# Patient Record
Sex: Female | Born: 2000 | Race: Black or African American | Hispanic: No | Marital: Single | Smoking: Never smoker
Health system: Southern US, Community
[De-identification: ages and names within clinical notes are randomized; demographics above are authoritative.]

## PROBLEM LIST (undated history)

## (undated) DIAGNOSIS — Z789 Other specified health status: Secondary | ICD-10-CM

---

## 2019-04-07 ENCOUNTER — Other Ambulatory Visit: Payer: Self-pay

## 2019-04-07 ENCOUNTER — Emergency Department (HOSPITAL_COMMUNITY)
Admission: EM | Admit: 2019-04-07 | Discharge: 2019-04-08 | Disposition: A | Discharge status: Home / Self Care | Payer: Medicaid Other | Attending: Emergency Medicine | Admitting: Emergency Medicine

## 2019-04-07 ENCOUNTER — Encounter (HOSPITAL_COMMUNITY): Payer: Self-pay

## 2019-04-07 DIAGNOSIS — T7840XA Allergy, unspecified, initial encounter: Secondary | ICD-10-CM | POA: Insufficient documentation

## 2019-04-07 MED ORDER — HYDROXYZINE HCL 25 MG PO TABS
25.0000 mg | ORAL_TABLET | Freq: Once | ORAL | Status: AC
  Administered 2019-04-07: 25 mg via ORAL
  Filled 2019-04-07: qty 1

## 2019-04-07 MED ORDER — EPINEPHRINE 0.3 MG/0.3ML IJ SOAJ: 0.3000 mg | INTRAMUSCULAR | 0 refills | Status: AC | PRN

## 2019-04-07 MED ORDER — DEXAMETHASONE SODIUM PHOSPHATE 10 MG/ML IJ SOLN
10.0000 mg | Freq: Once | INTRAMUSCULAR | Status: AC
  Administered 2019-04-07: 10 mg via INTRAMUSCULAR
  Filled 2019-04-07: qty 1

## 2019-04-07 NOTE — ED Triage Notes (Signed)
Patient states she works at  [employer] for the past 5 months. Patient states recently she has developed hives on both her feet, thigh, legs, and face.   Today patient states her left eye is still a little swollen from this mornings reaction.   Patient states she has not taken anything for the hives or reaction.   Hives are gone on patients feet and legs.    A/Ox4 Ambulatory in triage.   Denies voice changes or shob.

## 2019-04-07 NOTE — Discharge Instructions (Addendum)
Please take benadryl at home to help with itching or rash.  If you experience any shortness of breath, chest pain, or difficulty breathing call 911.

## 2019-04-07 NOTE — ED Provider Notes (Signed)
[facility]-EMERGENCY DEPT Provider Note   CSN: [id number] Arrival date & time: 04/07/19  1740     History   Chief Complaint Chief Complaint  Patient presents with  . Allergic Reaction    HPI Julia Morton is a 18 y.o. female.     18 y.o female with no PMH presents to the ED with a chief complaint of allergic reaction.  Patient reports she walked out of work this morning around 4 AM, began to have itching on bilateral feet, she then broke into hives with move from her feet to her legs, to her knees, chest and her eyes. She reports applying topical cream to help with her symptoms without improvement. She has not taken any medical therapy for relieve in symptoms. She reports a similar episode the day prior to this occurring. She denies any allergic contacts, changes in soap, known allergies. She denies any chest pain, shortness of breath or other symptoms. Patient arrived in the ED asymptomatic without hives but wanted to be evaluated.   The history is provided by the patient.  Allergic Reaction   History reviewed. No pertinent past medical history.  There are no active problems to display for this patient.   History reviewed. No pertinent surgical history.   OB History   No obstetric history on file.      Home Medications    Prior to Admission medications   Medication Sig Start Date End Date Taking? Authorizing Provider  EPINEPHrine 0.3 mg/0.3 mL IJ SOAJ injection Inject 0.3 mLs (0.3 mg total) into the muscle as needed for up to 1 day for anaphylaxis. 04/07/19 04/08/19  Claude MangesSoto, Jaretzi Droz, PA-C    Family History History reviewed. No pertinent family history.  Social History Social History   Tobacco Use  . Smoking status: Never Smoker  . Smokeless tobacco: Never Used  Substance Use Topics  . Alcohol use: Not Currently  . Drug use: Not Currently     Allergies   Patient has no allergy information on record.   Review of Systems Review of  Systems  Constitutional: Negative for fever.  Respiratory: Negative for shortness of breath.   Cardiovascular: Negative for chest pain.  Neurological: Negative for headaches.     Physical Exam Updated Vital Signs BP 110/68 (BP Location: Left Arm)   Pulse (!) 53   Temp 98.5 F (36.9 C) (Oral)   Resp 16   Ht 5\' 7"  (1.702 m)   Wt 64 kg   LMP 03/07/2019   SpO2 100%   BMI 22.08 kg/m   Physical Exam Vitals signs and nursing note reviewed.  Constitutional:      General: She is not in acute distress.    Appearance: Normal appearance. She is well-developed.  HENT:     Head: Normocephalic and atraumatic.     Mouth/Throat:     Pharynx: No oropharyngeal exudate.  Eyes:     Pupils: Pupils are equal, round, and reactive to light.  Neck:     Musculoskeletal: Normal range of motion.  Cardiovascular:     Rate and Rhythm: Regular rhythm.     Heart sounds: Normal heart sounds.  Pulmonary:     Effort: Pulmonary effort is normal. No respiratory distress.     Breath sounds: Normal breath sounds.  Abdominal:     General: Bowel sounds are normal. There is no distension.     Palpations: Abdomen is soft.     Tenderness: There is no abdominal tenderness.  Musculoskeletal:  General: No tenderness or deformity.     Right lower leg: No edema.     Left lower leg: No edema.  Skin:    General: Skin is warm and dry.     Comments: No hives, no rash, or blistering.  Neurological:     Mental Status: She is alert and oriented to person, place, and time.      ED Treatments / Results  Labs (all labs ordered are listed, but only abnormal results are displayed) Labs Reviewed - No data to display  EKG None  Radiology No results found.  Procedures Procedures (including critical care time)  Medications Ordered in ED Medications  hydrOXYzine (ATARAX/VISTARIL) tablet 25 mg (has no administration in time range)  dexamethasone (DECADRON) injection 10 mg (has no administration in time  range)     Initial Impression / Assessment and Plan / ED Course  I have reviewed the triage vital signs and the nursing notes.  Pertinent labs & imaging results that were available during my care of the patient were reviewed by me and considered in my medical decision making (see chart for details).       Patient presents to the ED with a chief complaint of allergic reaction episode this morning around 4am. She reports breaking into hives, rash from her legs onto her face. She applied some topical cream which improved her symptoms. She is asymptomatic in the ED without shortness of breath, chest pain which she reports she did not have during this episode.   She is non toxic, well appearing without any hives but does have some residual itching. No blistering, rashes, dysphagia, or shortness of breath. Will provide with atarax to help with itching.  She also received a decadron shot to suppress future reactions. Patient will be discharged home with an epi pen, was instructed on when to use epi pen. She understands and agrees with management. Return precautions at length.     Portions of this note were generated with [employer]. Dictation errors may occur despite best attempts at proofreading.  Final Clinical Impressions(s) / ED Diagnoses   Final diagnoses:  Allergic reaction, initial encounter    ED Discharge Orders         Ordered    EPINEPHrine 0.3 mg/0.3 mL IJ SOAJ injection  As needed     04/07/19 2327           Janeece Fitting, Hershal Coria 04/07/19 2329    Dorie Rank, MD 04/09/19 2041

## 2019-04-07 NOTE — ED Notes (Addendum)
Pt states this morning after getting off of work at [employer] this morning, she noticed tiny bumps on her ankles that spread to her hips and progressed to her neck and face. Pt states her eyes were swollen shut. Pt states that she was extremely itchy, so she used a cream that she had at home to help subside the itching.Pt states it helped until about 1pm this afternoon but after that the itching began again. Pt denies the use of new soaps or perfumes, pt denies ingestion of any unfamiliar food or drink. Pt denies any nausea, vomiting or diarrhea. Pt denies any difficulty breathing. Pt denies history of allergies or asthma. Pt states that she did purchase a new unscented lotion for sensitive skin.

## 2019-05-05 ENCOUNTER — Other Ambulatory Visit: Payer: Self-pay

## 2019-05-05 ENCOUNTER — Encounter (HOSPITAL_COMMUNITY): Payer: Self-pay

## 2019-05-05 ENCOUNTER — Ambulatory Visit (HOSPITAL_COMMUNITY)
Admission: EM | Admit: 2019-05-05 | Discharge: 2019-05-05 | Disposition: A | Discharge status: Home / Self Care | Payer: Medicaid Other | Attending: Emergency Medicine | Admitting: Emergency Medicine

## 2019-05-05 ENCOUNTER — Ambulatory Visit (INDEPENDENT_AMBULATORY_CARE_PROVIDER_SITE_OTHER): Payer: Medicaid Other

## 2019-05-05 DIAGNOSIS — S92524A Nondisplaced fracture of medial phalanx of right lesser toe(s), initial encounter for closed fracture: Secondary | ICD-10-CM

## 2019-05-05 MED ORDER — HYDROCODONE-ACETAMINOPHEN 5-325 MG PO TABS: 2.0000 | ORAL_TABLET | ORAL | 0 refills | Status: DC | PRN

## 2019-05-05 MED ORDER — IBUPROFEN 800 MG PO TABS: 800.0000 mg | ORAL_TABLET | Freq: Three times a day (TID) | ORAL | 0 refills | Status: DC | PRN

## 2019-05-05 NOTE — ED Provider Notes (Signed)
[facility]    CSN: [id number] Arrival date & time: 05/05/19  1558      History   Chief Complaint Chief Complaint  Patient presents with  . Toe Pain    HPI Julia Morton is a 18 y.o. female.   Patient presents with pain, bruising, and swelling in her right 5th toe after hitting it on the corner of her bed 3 days ago.  She denies numbness, weakness, paresthesias.  LMP: 04/12/2019.  The history is provided by the patient.    History reviewed. No pertinent past medical history.  There are no active problems to display for this patient.   History reviewed. No pertinent surgical history.  OB History   No obstetric history on file.      Home Medications    Prior to Admission medications   Medication Sig Start Date End Date Taking? Authorizing Provider  HYDROcodone-acetaminophen (NORCO/VICODIN) 5-325 MG tablet Take 2 tablets by mouth every 4 (four) hours as needed. 05/05/19   Sharion Balloon, NP  ibuprofen (ADVIL) 800 MG tablet Take 1 tablet (800 mg total) by mouth every 8 (eight) hours as needed. 05/05/19   Sharion Balloon, NP    Family History History reviewed. No pertinent family history.  Social History Social History   Tobacco Use  . Smoking status: Never Smoker  . Smokeless tobacco: Never Used  Substance Use Topics  . Alcohol use: Not Currently  . Drug use: Not Currently     Allergies   Patient has no known allergies.   Review of Systems Review of Systems  Constitutional: Negative for chills and fever.  HENT: Negative for ear pain and sore throat.   Eyes: Negative for pain and visual disturbance.  Respiratory: Negative for cough and shortness of breath.   Cardiovascular: Negative for chest pain and palpitations.  Gastrointestinal: Negative for abdominal pain and vomiting.  Genitourinary: Negative for dysuria and hematuria.  Musculoskeletal: Positive for arthralgias. Negative for back pain.  Skin: Negative for rash.  Neurological:  Negative for seizures and syncope.  All other systems reviewed and are negative.    Physical Exam Triage Vital Signs ED Triage Vitals  Enc Vitals Group     BP 05/05/19 1620 110/62     Pulse Rate 05/05/19 1620 84     Resp 05/05/19 1620 16     Temp 05/05/19 1620 98 F (36.7 C)     Temp Source 05/05/19 1620 Oral     SpO2 05/05/19 1620 100 %     Weight 05/05/19 1617 142 lb (64.4 kg)     Height --      Head Circumference --      Peak Flow --      Pain Score 05/05/19 1617 8     Pain Loc --      Pain Edu? --      Excl. in [place]? --    No data found.  Updated Vital Signs BP 110/62 (BP Location: Right Arm)   Pulse 84   Temp 98 F (36.7 C) (Oral)   Resp 16   Wt 142 lb (64.4 kg)   LMP 04/12/2019   SpO2 100%   BMI 22.24 kg/m   Visual Acuity Right Eye Distance:   Left Eye Distance:   Bilateral Distance:    Right Eye Near:   Left Eye Near:    Bilateral Near:     Physical Exam Vitals signs and nursing note reviewed.  Constitutional:      General:  She is not in acute distress.    Appearance: She is well-developed.  HENT:     Head: Normocephalic and atraumatic.  Eyes:     Conjunctiva/sclera: Conjunctivae normal.  Neck:     Musculoskeletal: Neck supple.  Cardiovascular:     Rate and Rhythm: Normal rate and regular rhythm.     Heart sounds: No murmur.  Pulmonary:     Effort: Pulmonary effort is normal. No respiratory distress.     Breath sounds: Normal breath sounds.  Abdominal:     Palpations: Abdomen is soft.     Tenderness: There is no abdominal tenderness.  Musculoskeletal:        General: Swelling and tenderness present.     Comments: Right 5th toe tender, edematous, ecchymotic.   Skin:    General: Skin is warm and dry.     Capillary Refill: Capillary refill takes less than 2 seconds.     Findings: Bruising present.  Neurological:     General: No focal deficit present.     Mental Status: She is alert and oriented to person, place, and time.     Sensory: No  sensory deficit.     Motor: No weakness.      UC Treatments / Results  Labs (all labs ordered are listed, but only abnormal results are displayed) Labs Reviewed - No data to display  EKG   Radiology Dg Foot Complete Right  Result Date: 05/05/2019 CLINICAL DATA:  Pain laterally after hitting foot against solid object EXAM: RIGHT FOOT COMPLETE - 3+ VIEW COMPARISON:  None. FINDINGS: Frontal, oblique, and lateral views were obtained. There is a nondisplaced fracture of the distal aspect of the fifth middle phalanx with alignment anatomic. No other evident fracture. No dislocation. Joint spaces appear normal. No erosive change. IMPRESSION: Nondisplaced fracture, distal portion of fifth middle phalanx. No other fracture. No dislocation. No appreciable arthropathy. These results will be called to the ordering clinician or representative by the Radiologist Assistant, and communication documented in the PACS or zVision Dashboard. Electronically Signed   By: Bretta Bang III M.D.   On: 05/05/2019 17:09    Procedures Procedures (including critical care time)  Medications Ordered in UC Medications - No data to display  Initial Impression / Assessment and Plan / UC Course  I have reviewed the triage vital signs and the nursing notes.  Pertinent labs & imaging results that were available during my care of the patient were reviewed by me and considered in my medical decision making (see chart for details).   Fractured fifth toe.  Xray shows "Nondisplaced fracture, distal portion of fifth middle phalanx".  Buddy tape and postop shoe applied.  Treating pain with ibuprofen and Norco as needed.  Precautions for drowsiness with Norco discussed with patient.  Instructed patient to follow-up with orthopedics.  Patient agrees to plan of care.   Final Clinical Impressions(s) / UC Diagnoses   Final diagnoses:  Closed nondisplaced fracture of middle phalanx of lesser toe of right foot, initial  encounter     Discharge Instructions     Take the ibuprofen as prescribed for pain.  Take the prescribed Norco as needed for severe pain; do not drive, operate machinery, or drink alcohol with this medication as it may cause drowsiness.    Rest and elevate your foot.  Apply ice packs 2-3 times a day for up to 20 minutes each.    Follow up with the orthopedist listed below.       ED  Prescriptions    Medication Sig Dispense Auth. Provider   ibuprofen (ADVIL) 800 MG tablet Take 1 tablet (800 mg total) by mouth every 8 (eight) hours as needed. 21 tablet Mickie Bailate, Terasa Orsini H, NP   HYDROcodone-acetaminophen (NORCO/VICODIN) 5-325 MG tablet Take 2 tablets by mouth every 4 (four) hours as needed. 6 tablet Mickie Bailate, Fergie Sherbert H, NP     I have reviewed the PDMP during this encounter.   Mickie Bailate, Kaysin Brock H, NP 05/05/19 1721

## 2019-05-05 NOTE — ED Triage Notes (Signed)
Pt states she stubed her right toe ( pinky ) on the corner of the bed. X 3 days ago.

## 2019-05-05 NOTE — Discharge Instructions (Signed)
Take the ibuprofen as prescribed for pain.  Take the prescribed Norco as needed for severe pain; do not drive, operate machinery, or drink alcohol with this medication as it may cause drowsiness.    Rest and elevate your foot.  Apply ice packs 2-3 times a day for up to 20 minutes each.    Follow up with the orthopedist listed below.

## 2019-07-01 LAB — OB RESULTS CONSOLE ABO/RH: RH Type: POSITIVE

## 2019-07-01 LAB — OB RESULTS CONSOLE RPR: RPR: NONREACTIVE

## 2019-07-01 LAB — OB RESULTS CONSOLE RUBELLA ANTIBODY, IGM: Rubella: IMMUNE

## 2019-07-01 LAB — OB RESULTS CONSOLE GC/CHLAMYDIA
Chlamydia: NEGATIVE
Gonorrhea: NEGATIVE

## 2019-07-01 LAB — OB RESULTS CONSOLE HIV ANTIBODY (ROUTINE TESTING): HIV: NONREACTIVE

## 2019-07-01 LAB — OB RESULTS CONSOLE HEPATITIS B SURFACE ANTIGEN: Hepatitis B Surface Ag: NEGATIVE

## 2019-07-24 NOTE — L&D Delivery Note (Signed)
Delivery Note   Patient Name: Julia Morton DOB: 26-Dec-2000 MRN: [id number]  Date of admission: 01/13/2020 Delivering MD: Dale Wallace  Date of delivery: 01/14/20 Type of delivery: SVD  Newborn Data: Live born female  Birth Weight:   APGAR: ,   Newborn Delivery   Birth date/time: 01/14/2020 03:25:00 Delivery type: Vaginal, Spontaneous     Julia Morton, 19 y.o., @ [redacted]w[redacted]d,  G1P0, who was admitted for Spontaneous labor early with elevated BP, Dx with GHTN, BP currently 132/69, denies HA, RUQ pain or vision changes, labs unremarkable, PCR 0.17, plan for repeat labs in the morning. I was called to the room when she progressed 2+ station in the second stage of labor. RN reported fetal prolonged decel to nadir of 60s, Fluid bolus given and maternal position chang resolved with rapid decent from 7cm to complete with SROM, clear @ 0230. Right bartholin cyst was noted again to be swollen, blunt needle aspiration was under and drained of fluid. She pushed for 20/min.  She delivered a viable infant, cephalic and restituted to the LOA position over an intact perineum.  A nuchal cord   was identified, plus one body cord, unable to reduce, baby was somersaulted through and tolerated well. The baby was placed on maternal abdomen while initial step of NRP were perfmored (Dry, Stimulated, and warmed). Hat placed on baby for thermoregulation. Delayed cord clamping was performed for 2 minutes.  Cord double clamped and cut.  Cord cut by father. Apgar scores were 8 and 9. Prophylactic Pitocin was started in the third stage of labor for active management. The placenta delivered spontaneously, shultz, with a 3 vessel cord and was sent to LD.  Inspection revealed none. An examination of the vaginal vault and cervix was free from lacerations. The uterus was firm, bleeding stable. Placenta and umbilical artery blood gas were not sent.  There were no complications during the procedure.  Mom and baby skin to skin  following delivery. Left in stable condition.  Maternal Info: Anesthesia: Epidural Episiotomy: No Lacerations:  No Suture Repair: No Est. Blood Loss (mL):   Newborn Info:  Baby Sex: female Circumcision: out pt circ Babies Name: Jamire APGAR (1 MIN):   APGAR (5 MINS):   APGAR (10 MINS):     Mom to postpartum.  Baby to Couplet care / Skin to Skin.    [place], [place], NP-C 01/14/20 3:49 AM

## 2019-08-28 ENCOUNTER — Other Ambulatory Visit (HOSPITAL_COMMUNITY): Payer: Self-pay | Admitting: Obstetrics and Gynecology

## 2019-08-28 DIAGNOSIS — Z3A22 22 weeks gestation of pregnancy: Secondary | ICD-10-CM

## 2019-08-28 DIAGNOSIS — Z3689 Encounter for other specified antenatal screening: Secondary | ICD-10-CM

## 2019-09-15 ENCOUNTER — Encounter (HOSPITAL_COMMUNITY): Payer: Self-pay | Admitting: *Deleted

## 2019-09-17 ENCOUNTER — Other Ambulatory Visit (HOSPITAL_COMMUNITY): Payer: Self-pay | Admitting: *Deleted

## 2019-09-17 ENCOUNTER — Ambulatory Visit (HOSPITAL_COMMUNITY)
Admission: RE | Admit: 2019-09-17 | Discharge: 2019-09-17 | Disposition: A | Discharge status: Home / Self Care | Payer: Medicaid Other | Source: Ambulatory Visit | Attending: Obstetrics and Gynecology | Admitting: Obstetrics and Gynecology

## 2019-09-17 ENCOUNTER — Other Ambulatory Visit: Payer: Self-pay

## 2019-09-17 DIAGNOSIS — Z363 Encounter for antenatal screening for malformations: Secondary | ICD-10-CM | POA: Diagnosis not present

## 2019-09-17 DIAGNOSIS — Z3A22 22 weeks gestation of pregnancy: Secondary | ICD-10-CM | POA: Insufficient documentation

## 2019-09-17 DIAGNOSIS — Z3689 Encounter for other specified antenatal screening: Secondary | ICD-10-CM | POA: Insufficient documentation

## 2019-09-17 DIAGNOSIS — Z362 Encounter for other antenatal screening follow-up: Secondary | ICD-10-CM

## 2019-09-18 ENCOUNTER — Other Ambulatory Visit (HOSPITAL_COMMUNITY): Payer: Self-pay | Admitting: Obstetrics & Gynecology

## 2019-09-18 ENCOUNTER — Other Ambulatory Visit (HOSPITAL_COMMUNITY): Payer: Self-pay | Admitting: Obstetrics and Gynecology

## 2019-09-18 ENCOUNTER — Encounter (HOSPITAL_COMMUNITY): Payer: Self-pay | Admitting: Obstetrics and Gynecology

## 2019-10-15 ENCOUNTER — Other Ambulatory Visit: Payer: Self-pay

## 2019-10-15 ENCOUNTER — Ambulatory Visit (HOSPITAL_COMMUNITY)
Admission: RE | Admit: 2019-10-15 | Discharge: 2019-10-15 | Disposition: A | Discharge status: Home / Self Care | Payer: Medicaid Other | Source: Ambulatory Visit | Attending: Obstetrics and Gynecology | Admitting: Obstetrics and Gynecology

## 2019-10-15 ENCOUNTER — Encounter (HOSPITAL_COMMUNITY): Payer: Self-pay

## 2019-10-15 ENCOUNTER — Ambulatory Visit (HOSPITAL_COMMUNITY): Payer: Medicaid Other

## 2019-10-15 DIAGNOSIS — Z362 Encounter for other antenatal screening follow-up: Secondary | ICD-10-CM | POA: Diagnosis not present

## 2019-10-15 DIAGNOSIS — Z3A26 26 weeks gestation of pregnancy: Secondary | ICD-10-CM | POA: Diagnosis not present

## 2019-10-15 DIAGNOSIS — O321XX0 Maternal care for breech presentation, not applicable or unspecified: Secondary | ICD-10-CM

## 2019-12-23 LAB — OB RESULTS CONSOLE GBS: GBS: NEGATIVE

## 2020-01-13 ENCOUNTER — Encounter (HOSPITAL_COMMUNITY): Payer: Self-pay

## 2020-01-13 ENCOUNTER — Inpatient Hospital Stay (HOSPITAL_COMMUNITY)
Admission: EM | Admit: 2020-01-13 | Discharge: 2020-01-13 | Disposition: A | Discharge status: Home / Self Care | Payer: Medicaid Other | Source: Home / Self Care | Attending: Emergency Medicine | Admitting: Emergency Medicine

## 2020-01-13 ENCOUNTER — Inpatient Hospital Stay (HOSPITAL_COMMUNITY)
Admission: AD | Admit: 2020-01-13 | Discharge: 2020-01-16 | DRG: 807 | Disposition: A | Discharge status: Home / Self Care | Payer: Medicaid Other | Attending: Obstetrics & Gynecology | Admitting: Obstetrics & Gynecology

## 2020-01-13 ENCOUNTER — Encounter (HOSPITAL_COMMUNITY): Payer: Self-pay | Admitting: Obstetrics and Gynecology

## 2020-01-13 ENCOUNTER — Other Ambulatory Visit: Payer: Self-pay

## 2020-01-13 ENCOUNTER — Inpatient Hospital Stay (HOSPITAL_COMMUNITY): Payer: Medicaid Other | Admitting: Anesthesiology

## 2020-01-13 DIAGNOSIS — O471 False labor at or after 37 completed weeks of gestation: Secondary | ICD-10-CM | POA: Insufficient documentation

## 2020-01-13 DIAGNOSIS — O134 Gestational [pregnancy-induced] hypertension without significant proteinuria, complicating childbirth: Principal | ICD-10-CM | POA: Diagnosis present

## 2020-01-13 DIAGNOSIS — O99893 Other specified diseases and conditions complicating puerperium: Secondary | ICD-10-CM | POA: Diagnosis present

## 2020-01-13 DIAGNOSIS — Z20822 Contact with and (suspected) exposure to covid-19: Secondary | ICD-10-CM | POA: Diagnosis present

## 2020-01-13 DIAGNOSIS — Z880 Allergy status to penicillin: Secondary | ICD-10-CM

## 2020-01-13 DIAGNOSIS — N750 Cyst of Bartholin's gland: Secondary | ICD-10-CM | POA: Diagnosis present

## 2020-01-13 DIAGNOSIS — O9902 Anemia complicating childbirth: Secondary | ICD-10-CM

## 2020-01-13 DIAGNOSIS — O479 False labor, unspecified: Secondary | ICD-10-CM

## 2020-01-13 DIAGNOSIS — O139 Gestational [pregnancy-induced] hypertension without significant proteinuria, unspecified trimester: Secondary | ICD-10-CM | POA: Diagnosis present

## 2020-01-13 DIAGNOSIS — R030 Elevated blood-pressure reading, without diagnosis of hypertension: Secondary | ICD-10-CM | POA: Diagnosis present

## 2020-01-13 DIAGNOSIS — Z3A39 39 weeks gestation of pregnancy: Secondary | ICD-10-CM

## 2020-01-13 HISTORY — DX: Other specified health status: Z78.9

## 2020-01-13 LAB — TYPE AND SCREEN
ABO/RH(D): O POS
Antibody Screen: NEGATIVE

## 2020-01-13 LAB — CBC
HCT: 36.9 % (ref 36.0–46.0)
Hemoglobin: 11.5 g/dL — ABNORMAL LOW (ref 12.0–15.0)
MCH: 25.2 pg — ABNORMAL LOW (ref 26.0–34.0)
MCHC: 31.2 g/dL (ref 30.0–36.0)
MCV: 80.9 fL (ref 80.0–100.0)
Platelets: 275 10*3/uL (ref 150–400)
RBC: 4.56 MIL/uL (ref 3.87–5.11)
RDW: 14.2 % (ref 11.5–15.5)
WBC: 17.3 10*3/uL — ABNORMAL HIGH (ref 4.0–10.5)
nRBC: 0 % (ref 0.0–0.2)

## 2020-01-13 LAB — COMPREHENSIVE METABOLIC PANEL
ALT: 14 U/L (ref 0–44)
ALT: 15 U/L (ref 0–44)
AST: 21 U/L (ref 15–41)
AST: 22 U/L (ref 15–41)
Albumin: 3.2 g/dL — ABNORMAL LOW (ref 3.5–5.0)
Albumin: 2.9 g/dL — ABNORMAL LOW (ref 3.5–5.0)
Alkaline Phosphatase: 172 U/L — ABNORMAL HIGH (ref 38–126)
Alkaline Phosphatase: 173 U/L — ABNORMAL HIGH (ref 38–126)
Anion gap: 8 (ref 5–15)
Anion gap: 9 (ref 5–15)
BUN: 9 mg/dL (ref 6–20)
BUN: 8 mg/dL (ref 6–20)
CO2: 21 mmol/L — ABNORMAL LOW (ref 22–32)
CO2: 19 mmol/L — ABNORMAL LOW (ref 22–32)
Calcium: 9.4 mg/dL (ref 8.9–10.3)
Calcium: 9.3 mg/dL (ref 8.9–10.3)
Chloride: 106 mmol/L (ref 98–111)
Chloride: 106 mmol/L (ref 98–111)
Creatinine, Ser: 0.75 mg/dL (ref 0.44–1.00)
Creatinine, Ser: 0.72 mg/dL (ref 0.44–1.00)
GFR calc Af Amer: >60 mL/min (ref >60)
GFR calc Af Amer: >60 mL/min (ref >60)
GFR calc non Af Amer: >60 mL/min (ref >60)
GFR calc non Af Amer: >60 mL/min (ref >60)
Glucose, Bld: 96 mg/dL (ref 70–99)
Glucose, Bld: 102 mg/dL — ABNORMAL HIGH (ref 70–99)
Potassium: 4 mmol/L (ref 3.5–5.1)
Potassium: 4.2 mmol/L (ref 3.5–5.1)
Sodium: 135 mmol/L (ref 135–145)
Sodium: 134 mmol/L — ABNORMAL LOW (ref 135–145)
Total Bilirubin: 0.6 mg/dL (ref 0.3–1.2)
Total Bilirubin: 0.7 mg/dL (ref 0.3–1.2)
Total Protein: 6.6 g/dL (ref 6.5–8.1)
Total Protein: 6 g/dL — ABNORMAL LOW (ref 6.5–8.1)

## 2020-01-13 LAB — CBC WITH DIFFERENTIAL/PLATELET
Abs Immature Granulocytes: 0.21 10*3/uL — ABNORMAL HIGH (ref 0.00–0.07)
Basophils Absolute: 0 10*3/uL (ref 0.0–0.1)
Basophils Relative: 0 %
Eosinophils Absolute: 0.1 10*3/uL (ref 0.0–0.5)
Eosinophils Relative: 1 %
HCT: 36.1 % (ref 36.0–46.0)
Hemoglobin: 11.4 g/dL — ABNORMAL LOW (ref 12.0–15.0)
Immature Granulocytes: 2 %
Lymphocytes Relative: 15 %
Lymphs Abs: 2 10*3/uL (ref 0.7–4.0)
MCH: 25.5 pg — ABNORMAL LOW (ref 26.0–34.0)
MCHC: 31.6 g/dL (ref 30.0–36.0)
MCV: 80.8 fL (ref 80.0–100.0)
Monocytes Absolute: 1.1 10*3/uL — ABNORMAL HIGH (ref 0.1–1.0)
Monocytes Relative: 8 %
Neutro Abs: 10 10*3/uL — ABNORMAL HIGH (ref 1.7–7.7)
Neutrophils Relative %: 74 %
Platelets: 235 10*3/uL (ref 150–400)
RBC: 4.47 MIL/uL (ref 3.87–5.11)
RDW: 14.3 % (ref 11.5–15.5)
WBC: 13.5 10*3/uL — ABNORMAL HIGH (ref 4.0–10.5)
nRBC: 0 % (ref 0.0–0.2)

## 2020-01-13 LAB — PROTEIN / CREATININE RATIO, URINE
Creatinine, Urine: 114.67 mg/dL
Protein Creatinine Ratio: 0.17 mg/mg{Cre} — ABNORMAL HIGH (ref 0.00–0.15)
Total Protein, Urine: 19 mg/dL

## 2020-01-13 LAB — LACTATE DEHYDROGENASE: LDH: 174 U/L (ref 98–192)

## 2020-01-13 LAB — URIC ACID: Uric Acid, Serum: 4.1 mg/dL (ref 2.5–7.1)

## 2020-01-13 LAB — ABO/RH: ABO/RH(D): O POS

## 2020-01-13 LAB — SARS CORONAVIRUS 2 BY RT PCR (HOSPITAL ORDER, PERFORMED IN ~~LOC~~ HOSPITAL LAB): SARS Coronavirus 2: NEGATIVE

## 2020-01-13 LAB — RPR: RPR Ser Ql: NONREACTIVE

## 2020-01-13 MED ORDER — DIPHENHYDRAMINE HCL 50 MG/ML IJ SOLN: 12.5000 mg | INTRAMUSCULAR | Status: DC | PRN

## 2020-01-13 MED ORDER — OXYTOCIN BOLUS FROM INFUSION
333.0000 mL | Freq: Once | INTRAVENOUS | Status: AC
  Administered 2020-01-14: 333 mL via INTRAVENOUS

## 2020-01-13 MED ORDER — DIPHENHYDRAMINE HCL 50 MG/ML IJ SOLN
12.5000 mg | INTRAMUSCULAR | Status: DC | PRN
  Administered 2020-01-13: 12.5 mg via INTRAVENOUS
  Filled 2020-01-13: qty 1

## 2020-01-13 MED ORDER — PHENYLEPHRINE 40 MCG/ML (10ML) SYRINGE FOR IV PUSH (FOR BLOOD PRESSURE SUPPORT): 80.0000 ug | PREFILLED_SYRINGE | INTRAVENOUS | Status: DC | PRN

## 2020-01-13 MED ORDER — EPHEDRINE 5 MG/ML INJ: 10.0000 mg | INTRAVENOUS | Status: DC | PRN

## 2020-01-13 MED ORDER — LACTATED RINGERS IV SOLN: 500.0000 mL | INTRAVENOUS | Status: DC | PRN

## 2020-01-13 MED ORDER — LIDOCAINE HCL (PF) 1 % IJ SOLN: 30.0000 mL | INTRAMUSCULAR | Status: DC | PRN

## 2020-01-13 MED ORDER — MORPHINE SULFATE (PF) 4 MG/ML IV SOLN
2.0000 mg | Freq: Once | INTRAVENOUS | Status: AC
  Administered 2020-01-13: 2 mg via INTRAVENOUS
  Filled 2020-01-13: qty 1

## 2020-01-13 MED ORDER — FENTANYL CITRATE (PF) 100 MCG/2ML IJ SOLN
100.0000 ug | Freq: Once | INTRAMUSCULAR | Status: AC
  Administered 2020-01-13: 100 ug via INTRAVENOUS
  Filled 2020-01-13: qty 2

## 2020-01-13 MED ORDER — ACETAMINOPHEN 325 MG PO TABS: 650.0000 mg | ORAL_TABLET | ORAL | Status: DC | PRN

## 2020-01-13 MED ORDER — OXYTOCIN-SODIUM CHLORIDE 30-0.9 UT/500ML-% IV SOLN
1.0000 m[IU]/min | INTRAVENOUS | Status: DC
  Filled 2020-01-13: qty 500

## 2020-01-13 MED ORDER — OXYTOCIN-SODIUM CHLORIDE 30-0.9 UT/500ML-% IV SOLN: 2.5000 [IU]/h | INTRAVENOUS | Status: DC

## 2020-01-13 MED ORDER — PROMETHAZINE HCL 25 MG/ML IJ SOLN
12.5000 mg | Freq: Once | INTRAMUSCULAR | Status: AC
  Administered 2020-01-13: 12.5 mg via INTRAVENOUS
  Filled 2020-01-13: qty 1

## 2020-01-13 MED ORDER — SODIUM CHLORIDE (PF) 0.9 % IJ SOLN
INTRAMUSCULAR | Status: DC | PRN
  Administered 2020-01-13: 12 mL/h via EPIDURAL

## 2020-01-13 MED ORDER — TERBUTALINE SULFATE 1 MG/ML IJ SOLN: 0.2500 mg | Freq: Once | INTRAMUSCULAR | Status: DC | PRN

## 2020-01-13 MED ORDER — FENTANYL-BUPIVACAINE-NACL 0.5-0.125-0.9 MG/250ML-% EP SOLN
12.0000 mL/h | EPIDURAL | Status: DC | PRN
  Filled 2020-01-13: qty 250

## 2020-01-13 MED ORDER — ONDANSETRON HCL 4 MG/2ML IJ SOLN: 4.0000 mg | Freq: Four times a day (QID) | INTRAMUSCULAR | Status: DC | PRN

## 2020-01-13 MED ORDER — LACTATED RINGERS IV SOLN: INTRAVENOUS | Status: DC

## 2020-01-13 MED ORDER — NALBUPHINE HCL 10 MG/ML IJ SOLN: 5.0000 mg | Freq: Once | INTRAMUSCULAR | Status: DC

## 2020-01-13 MED ORDER — OXYCODONE-ACETAMINOPHEN 5-325 MG PO TABS: 1.0000 | ORAL_TABLET | ORAL | Status: DC | PRN

## 2020-01-13 MED ORDER — FENTANYL-BUPIVACAINE-NACL 0.5-0.125-0.9 MG/250ML-% EP SOLN: 12.0000 mL/h | EPIDURAL | Status: DC | PRN

## 2020-01-13 MED ORDER — SODIUM CHLORIDE 0.9 % IV BOLUS (SEPSIS)
1000.0000 mL | Freq: Once | INTRAVENOUS | Status: AC
  Administered 2020-01-13: 1000 mL via INTRAVENOUS

## 2020-01-13 MED ORDER — FENTANYL CITRATE (PF) 100 MCG/2ML IJ SOLN
50.0000 ug | INTRAMUSCULAR | Status: DC | PRN
  Administered 2020-01-13 (×2): 100 ug via INTRAVENOUS
  Filled 2020-01-13 (×2): qty 2

## 2020-01-13 MED ORDER — LACTATED RINGERS IV SOLN
500.0000 mL | Freq: Once | INTRAVENOUS | Status: AC
  Administered 2020-01-14: 500 mL via INTRAVENOUS

## 2020-01-13 MED ORDER — LIDOCAINE HCL (PF) 1 % IJ SOLN
INTRAMUSCULAR | Status: DC | PRN
  Administered 2020-01-13: 6 mL via EPIDURAL

## 2020-01-13 MED ORDER — ONDANSETRON HCL 4 MG/2ML IJ SOLN
4.0000 mg | Freq: Once | INTRAMUSCULAR | Status: AC
  Administered 2020-01-13: 4 mg via INTRAVENOUS
  Filled 2020-01-13: qty 2

## 2020-01-13 MED ORDER — OXYCODONE-ACETAMINOPHEN 5-325 MG PO TABS: 2.0000 | ORAL_TABLET | ORAL | Status: DC | PRN

## 2020-01-13 MED ORDER — SOD CITRATE-CITRIC ACID 500-334 MG/5ML PO SOLN: 30.0000 mL | ORAL | Status: DC | PRN

## 2020-01-13 NOTE — Anesthesia Procedure Notes (Signed)
Epidural Patient location during procedure: OB Start time: 01/13/2020 8:12 PM End time: 01/13/2020 9:15 PM  Staffing Anesthesiologist: Bethena Midget, MD  Preanesthetic Checklist Completed: patient identified, IV checked, site marked, risks and benefits discussed, surgical consent, monitors and equipment checked, pre-op evaluation and timeout performed  Epidural Patient position: sitting Prep: DuraPrep and site prepped and draped Patient monitoring: continuous pulse ox and blood pressure Approach: midline Location: L3-L4 Injection technique: LOR air  Needle:  Needle type: Tuohy  Needle gauge: 17 G Needle length: 9 cm and 9 Needle insertion depth: 7 cm Catheter type: closed end flexible Catheter size: 19 Gauge Catheter at skin depth: 12 cm Test dose: negative  Assessment Events: blood not aspirated, injection not painful, no injection resistance, no paresthesia and negative IV test

## 2020-01-13 NOTE — MAU Provider Note (Signed)
Chief Complaint:  Laboring and Contractions    HPI: Julia Morton is a 19 y.o. G1P0 at [redacted]w[redacted]d who presents to maternity admissions reporting cxt that are classified as painful. Pt was at [facility] and given fentanyl for pain and transferred to our MAU, pt stable upon arrival ,and made 1cm cervical change, pt was given 2mg  morphine and 12.5mg  phenergan for nausea during her stay, but only made amount 0.5cm change over 4 hours, pt was discharged to home in stable condition and given labor precautions and informed to return if cxt became more regular. Denies contractions, leakage of fluid or vaginal bleeding. Good fetal movement.   Pregnancy Course:   Past Medical History:  Diagnosis Date   Medical history non-contributory    OB History  Gravida Para Term Preterm AB Living  1            SAB TAB Ectopic Multiple Live Births               # Outcome Date GA Lbr Len/2nd Weight Sex Delivery Anes PTL Lv  1 Current            History reviewed. No pertinent surgical history. History reviewed. No pertinent family history. Social History   Tobacco Use   Smoking status: Never Smoker   Smokeless tobacco: Never Used  Substance Use Topics   Alcohol use: Not Currently   Drug use: Not Currently   No Known Allergies No medications prior to admission.    I have reviewed patient's Past Medical Hx, Surgical Hx, Family Hx, Social Hx, medications and allergies.   ROS:  Review of Systems  Constitutional: Negative.   HENT: Negative.   Eyes: Negative.   Respiratory: Negative.   Cardiovascular: Negative.   Gastrointestinal: Positive for abdominal pain.  Endocrine: Negative.   Genitourinary: Negative.   Musculoskeletal: Negative.   Skin: Negative.   Allergic/Immunologic: Negative.   Neurological: Negative.   Hematological: Negative.   Psychiatric/Behavioral: Negative.     Physical Exam   Patient Vitals for the past 24 hrs:  BP Temp Temp src Pulse Resp SpO2  01/13/20 0843  127/83 98.6 F (37 C) Oral 82 18 --  01/13/20 0840 127/83 -- -- 75 -- 99 %  01/13/20 0830 137/81 -- -- 73 -- --  01/13/20 0604 135/71 -- -- 69 -- --  01/13/20 0527 130/73 98.4 F (36.9 C) Oral 72 -- --  01/13/20 0500 (!) 129/112 -- -- 84 12 98 %  01/13/20 0458 120/69 -- -- 80 -- 98 %  01/13/20 0454 -- -- -- 78 10 97 %  01/13/20 0445 (!) 141/84 -- -- 84 16 99 %  01/13/20 0430 (!) 148/100 99.7 F (37.6 C) Oral 83 10 99 %   Constitutional: Well-developed, well-nourished female in no acute distress.  Cardiovascular: normal rate Respiratory: normal effort GI: Abd soft, non-tender, gravid appropriate for gestational age. Pos BS x 4 MS: Extremities nontender, no edema, normal ROM Neurologic: Alert and oriented x 4.  GU: Neg CVAT.  Pelvic: NEFG, physiologic discharge, no blood, cervix clean. Pelvic adequate for labor. No CMT  Dilation: 2.5 Effacement (%): 70 Station: -2 Presentation: Vertex Exam by:: Tawanda Schall   NST: FHR baseline 125 bpm, Variability: moderate, Accelerations:present, Decelerations:  Absent= Cat 1/Reactive UC:   irregular, every 2-5 minutes, lasting 60 seconds.  SVE:   Dilation: 2.5 Effacement (%): 70 Station: -2 Exam by:: [facility], vertex verified by fetal sutures.  Leopold's: Position vertex, EFW 7lbs via leopold's.  Labs: Results for orders placed or performed during the hospital encounter of 01/13/20 (from the past 24 hour(s))  RPR     Status: None   Collection Time: 01/13/20  4:40 AM  Result Value Ref Range   RPR Ser Ql NON REACTIVE NON REACTIVE  CBC with Differential     Status: Abnormal   Collection Time: 01/13/20  4:43 AM  Result Value Ref Range   WBC 13.5 (H) 4.0 - 10.5 K/uL   RBC 4.47 3.87 - 5.11 MIL/uL   Hemoglobin 11.4 (L) 12.0 - 15.0 g/dL   HCT 36.1 36 - 46 %   MCV 80.8 80.0 - 100.0 fL   MCH 25.5 (L) 26.0 - 34.0 pg   MCHC 31.6 30.0 - 36.0 g/dL   RDW 14.3 11.5 - 15.5 %   Platelets 235 150 - 400 K/uL   nRBC 0.0 0.0 - 0.2 %    Neutrophils Relative % 74 %   Neutro Abs 10.0 (H) 1.7 - 7.7 K/uL   Lymphocytes Relative 15 %   Lymphs Abs 2.0 0.7 - 4.0 K/uL   Monocytes Relative 8 %   Monocytes Absolute 1.1 (H) 0 - 1 K/uL   Eosinophils Relative 1 %   Eosinophils Absolute 0.1 0 - 0 K/uL   Basophils Relative 0 %   Basophils Absolute 0.0 0 - 0 K/uL   Immature Granulocytes 2 %   Abs Immature Granulocytes 0.21 (H) 0.00 - 0.07 K/uL  Comprehensive metabolic panel     Status: Abnormal   Collection Time: 01/13/20  4:43 AM  Result Value Ref Range   Sodium 135 135 - 145 mmol/L   Potassium 4.0 3.5 - 5.1 mmol/L   Chloride 106 98 - 111 mmol/L   CO2 21 (L) 22 - 32 mmol/L   Glucose, Bld 96 70 - 99 mg/dL   BUN 9 6 - 20 mg/dL   Creatinine, Ser 0.75 0.44 - 1.00 mg/dL   Calcium 9.4 8.9 - 10.3 mg/dL   Total Protein 6.6 6.5 - 8.1 g/dL   Albumin 3.2 (L) 3.5 - 5.0 g/dL   AST 21 15 - 41 U/L   ALT 14 0 - 44 U/L   Alkaline Phosphatase 172 (H) 38 - 126 U/L   Total Bilirubin 0.6 0.3 - 1.2 mg/dL   GFR calc non Af Amer >60 >60 mL/min   GFR calc Af Amer >60 >60 mL/min   Anion gap 8 5 - 15    Imaging:  No results found.  MAU Course: Orders Placed This Encounter  Procedures   Urinalysis, Routine w reflex microscopic   CBC with Differential   Comprehensive metabolic panel   RPR   OB RESULTS CONSOLE GC/Chlamydia   OB RESULTS CONSOLE RPR   OB RESULTS CONSOLE HIV antibody   OB RESULTS CONSOLE Rubella Antibody   OB RESULTS CONSOLE Hepatitis B surface antigen   Continuous tocometry   Fetal Kick Count:  Lie on our left side for one hour after a meal, and count the number of times your baby kicks.  If it is less than 5 times, get up, move around and drink some juice.  Repeat the test 30 minutes later.  If it is still less than 5 kicks in an hour, notify your doctor.   OB RESULTS CONSOLE ABO/Rh   Discharge patient Discharge disposition: 01-Home or Self Care; Discharge patient date: 01/13/2020   Meds ordered this encounter   Medications   sodium chloride 0.9 % bolus 1,000 mL  fentaNYL (SUBLIMAZE) injection 100 mcg   ondansetron (ZOFRAN) injection 4 mg   DISCONTD: nalbuphine (NUBAIN) injection 5 mg   promethazine (PHENERGAN) injection 12.5 mg   morphine 4 MG/ML injection 2 mg    MDM: DR Dillard consulted, NST reactive, mild cervical change, pt discharged.   Assessment: 1. False labor   Julia Morton is a 19 y.o. G1P0 at [redacted]w[redacted]d who presents to maternity admissions reporting cxt that are classified as painful. Pt was at [facility] hospital and given fentanyl for pain and transferred to our MAU, pt stable upon arrival ,and made 1cm cervical change over 3 hours, pt was given 2mg  morphine and 12.5mg  phenergan for nausea during her stay, but only made amount 0.5cm change over 1.5 hours, pt was discharged to home in stable condition and given labor precautions and informed to return if cxt became more regular. Denies contractions, leakage of fluid or vaginal bleeding. Good fetal movement.  Plan: Discharge home in stable condition.  Labor precautions and fetal kick counts  Follow-up Information    [facility] Follow up.   Specialty: Obstetrics and Gynecology Why: ROB Contact information: [address] [place] [phone]              Allergies as of 01/13/2020   No Known Allergies     Medication List    TAKE these medications   Prenatal Gummies/DHA & FA 0.4-32.5 MG Chew Chew by mouth.       [facility] NP-C, CNM [place], [place], [place] 01/13/2020 1:11 PM

## 2020-01-13 NOTE — MAU Note (Signed)
Contractions since midnight, denies bleeding or leaking of fluid. Uncomplicated pregnancy

## 2020-01-13 NOTE — Progress Notes (Signed)
0424: OBRRN called to [facility] for pt term, potentially laboring.   7948[name] at bedside. Pt sees [facility]. 39.3 GA, G1P0. Reports ctx began around midnight and became unbearable around 3AM. Pt reports no issues with pregnancy. No LOF or vaginal bleeding. Pt endorses fetal movement. ED already spoke to [facility] who ordered transfer of pt to MAU.   0440: SVE perfeormed 1.5/60/-2 vtx.  0454: [facility] at bedside to transport pt to MAU.

## 2020-01-13 NOTE — Anesthesia Preprocedure Evaluation (Signed)
Anesthesia Evaluation  Patient identified by MRN, date of birth, ID band Patient awake    Reviewed: Allergy & Precautions, H&P , NPO status , Patient's Chart, lab work & pertinent test results, reviewed documented beta blocker date and time   Airway Mallampati: II  TM Distance: >3 FB Neck ROM: full    Dental no notable dental hx.    Pulmonary neg pulmonary ROS,    Pulmonary exam normal breath sounds clear to auscultation       Cardiovascular negative cardio ROS Normal cardiovascular exam Rhythm:regular Rate:Normal     Neuro/Psych negative neurological ROS  negative psych ROS   GI/Hepatic negative GI ROS, Neg liver ROS,   Endo/Other  negative endocrine ROS  Renal/GU negative Renal ROS  negative genitourinary   Musculoskeletal   Abdominal   Peds  Hematology negative hematology ROS (+)   Anesthesia Other Findings   Reproductive/Obstetrics (+) Pregnancy                             Anesthesia Physical Anesthesia Plan  ASA: II  Anesthesia Plan: Epidural   Post-op Pain Management:    Induction:   PONV Risk Score and Plan: 2  Airway Management Planned:   Additional Equipment:   Intra-op Plan:   Post-operative Plan:   Informed Consent: I have reviewed the patients History and Physical, chart, labs and discussed the procedure including the risks, benefits and alternatives for the proposed anesthesia with the patient or authorized representative who has indicated his/her understanding and acceptance.     Dental Advisory Given  Plan Discussed with: Anesthesiologist  Anesthesia Plan Comments: (Labs checked- platelets confirmed with RN in room. Fetal heart tracing, per RN, reported to be stable enough for sitting procedure. Discussed epidural, and patient consents to the procedure:  included risk of possible headache,backache, failed block, allergic reaction, and nerve injury. This  patient was asked if she had any questions or concerns before the procedure started.)        Anesthesia Quick Evaluation  

## 2020-01-13 NOTE — H&P (Signed)
Julia Morton is a 19 y.o. female, G1P0000, IUP at 39.3 weeks, presenting for spontaneous early labor with elevated BP, denies HA, RUQ pain, or vision changes, labs pending, normotensive through pregnancy.  Growth [place] on 5/12 was 5.6lbs, GBS- on 6/2, low risk female fetus. Pt endorse + Fm. Denies vaginal leakage. Denies vaginal bleeding.   There are no problems to display for this patient.    Medications Prior to Admission  Medication Sig Dispense Refill Last Dose   Prenatal MV-Min-FA-Omega-3 (PRENATAL GUMMIES/DHA & FA) 0.4-32.5 MG CHEW Chew by mouth.       Past Medical History:  Diagnosis Date   Medical history non-contributory      No current facility-administered medications on file prior to encounter.   Current Outpatient Medications on File Prior to Encounter  Medication Sig Dispense Refill   Prenatal MV-Min-FA-Omega-3 (PRENATAL GUMMIES/DHA & FA) 0.4-32.5 MG CHEW Chew by mouth.       No Known Allergies  History of present pregnancy: Pt Info/Preference:  Screening/Consents:  Labs:   EDD: Estimated Date of Delivery: 01/17/20  Establised: Patient's last menstrual period was 04/12/2019.  Anatomy Scan: Date: 10/15/2019 Placenta Location: posterior Genetic Screen: Panoroma:low risk female AFP:  First Tri: Quad:  Office: [facility]             First PNV: 11 wg Blood Type O/Positive/-- (12/09 0000)  Language: english Last PNV: 38.4 wg Rhogam    Flu Vaccine:  declined   Antibody    TDaP vaccine declined   GTT: Early: 5.4 hga1c Third Trimester: 99  Feeding Plan: Breast/bottle BTL: no Rubella: Immune (12/09 0000)  Contraception: ??? VBAC: no RPR: NON REACTIVE (06/23 0440)   Circumcision: ???   HBsAg: Negative (12/09 0000)  Pediatrician:  ???   HIV: Non-reactive (12/09 0000)   Prenatal Classes: no Additional [place]: Growth see above  GBS:  (For PCN allergy, check sensitivities)       Chlamydia: neg    MFM Referral/Consult:  GC: neg  Support Person: partner   PAP: N/A, underage  Pain  Management: epidural Neonatologist Referral:  Hgb Electrophoresis:  AA  Birth Plan: none   Hgb NOB: 14.1    28W: 12.5   OB History    Gravida  1   Para      Term      Preterm      AB      Living        SAB      TAB      Ectopic      Multiple      Live Births             Past Medical History:  Diagnosis Date   Medical history non-contributory    No past surgical history on file. Family History: family history is not on file. Social History:  reports that she has never smoked. She has never used smokeless tobacco. She reports previous alcohol use. She reports previous drug use.   Prenatal Transfer Tool  Maternal Diabetes: No Genetic Screening: Normal Maternal Ultrasounds/Referrals: Normal Fetal Ultrasounds or other Referrals:  None Maternal Substance Abuse:  No Significant Maternal Medications:  None Significant Maternal Lab Results: Group B Strep negative  ROS:  Review of Systems  Constitutional: Negative.   HENT: Negative.   Eyes: Negative.   Respiratory: Negative.   Cardiovascular: Negative.   Gastrointestinal: Positive for abdominal pain.  Genitourinary: Negative.   Musculoskeletal: Negative.   Skin: Negative.   Neurological: Negative.   Endo/Heme/Allergies:  Negative.   Psychiatric/Behavioral: Negative.      Physical Exam: LMP 04/12/2019   Physical Exam Vitals and nursing note reviewed.  Constitutional:      Appearance: Normal appearance.  HENT:     Head: Normocephalic and atraumatic.     Nose: Nose normal.     Mouth/Throat:     Mouth: Mucous membranes are moist.     Pharynx: Oropharynx is clear.  Eyes:     Extraocular Movements: Extraocular movements intact.     Conjunctiva/sclera: Conjunctivae normal.     Pupils: Pupils are equal, round, and reactive to light.  Cardiovascular:     Rate and Rhythm: Normal rate and regular rhythm.     Pulses: Normal pulses.     Heart sounds: Normal heart sounds.  Pulmonary:     Effort: Pulmonary  effort is normal.     Breath sounds: Normal breath sounds.  Abdominal:     Palpations: Abdomen is soft.  Genitourinary:    Rectum: Normal.     Comments: Enlarged, erythematous right bartholinian cyst 2x3cm. Uterus gravida equal to dates, soft non-tender, pelvis adequate for vaginal delivery  Musculoskeletal:     Cervical back: Normal range of motion and neck supple.  Skin:    General: Skin is warm and dry.     Capillary Refill: Capillary refill takes less than 2 seconds.  Neurological:     General: No focal deficit present.     Mental Status: She is alert and oriented to person, place, and time.     Deep Tendon Reflexes: Reflexes normal.     Comments: No clonus  Psychiatric:        Behavior: Behavior normal.      NST: FHR baseline 140 bpm, Variability: moderate, Accelerations:present, Decelerations:  Absent= Cat 1/Reactive UC:   regular, every 3-5 minutes, lasting 80-120 seocnds SVE:   Dilation: 3.5 Effacement (%): 90, vertex verified by fetal sutures.  Leopold's: Position vertex, EFW 7lbs via leopold's.   Labs: Results for orders placed or performed during the hospital encounter of 01/13/20 (from the past 24 hour(s))  RPR     Status: None   Collection Time: 01/13/20  4:40 AM  Result Value Ref Range   RPR Ser Ql NON REACTIVE NON REACTIVE  CBC with Differential     Status: Abnormal   Collection Time: 01/13/20  4:43 AM  Result Value Ref Range   WBC 13.5 (H) 4.0 - 10.5 K/uL   RBC 4.47 3.87 - 5.11 MIL/uL   Hemoglobin 11.4 (L) 12.0 - 15.0 g/dL   HCT 08.6 36 - 46 %   MCV 80.8 80.0 - 100.0 fL   MCH 25.5 (L) 26.0 - 34.0 pg   MCHC 31.6 30.0 - 36.0 g/dL   RDW 57.8 46.9 - 62.9 %   Platelets 235 150 - 400 K/uL   nRBC 0.0 0.0 - 0.2 %   Neutrophils Relative % 74 %   Neutro Abs 10.0 (H) 1.7 - 7.7 K/uL   Lymphocytes Relative 15 %   Lymphs Abs 2.0 0.7 - 4.0 K/uL   Monocytes Relative 8 %   Monocytes Absolute 1.1 (H) 0 - 1 K/uL   Eosinophils Relative 1 %   Eosinophils Absolute 0.1  0 - 0 K/uL   Basophils Relative 0 %   Basophils Absolute 0.0 0 - 0 K/uL   Immature Granulocytes 2 %   Abs Immature Granulocytes 0.21 (H) 0.00 - 0.07 K/uL  Comprehensive metabolic panel     Status: Abnormal  Collection Time: 01/13/20  4:43 AM  Result Value Ref Range   Sodium 135 135 - 145 mmol/L   Potassium 4.0 3.5 - 5.1 mmol/L   Chloride 106 98 - 111 mmol/L   CO2 21 (L) 22 - 32 mmol/L   Glucose, Bld 96 70 - 99 mg/dL   BUN 9 6 - 20 mg/dL   Creatinine, Ser 4.69 0.44 - 1.00 mg/dL   Calcium 9.4 8.9 - 62.9 mg/dL   Total Protein 6.6 6.5 - 8.1 g/dL   Albumin 3.2 (L) 3.5 - 5.0 g/dL   AST 21 15 - 41 U/L   ALT 14 0 - 44 U/L   Alkaline Phosphatase 172 (H) 38 - 126 U/L   Total Bilirubin 0.6 0.3 - 1.2 mg/dL   GFR calc non Af Amer >60 >60 mL/min   GFR calc Af Amer >60 >60 mL/min   Anion gap 8 5 - 15    Imaging:  No results found.  MAU Course: Orders Placed This Encounter  Procedures   Cervical Exam   No orders of the defined types were placed in this encounter.   Assessment/Plan: Lina Hitch is a 19 y.o. female, G1P0000, IUP at 39.3 weeks, presenting for spontaneous early labor with elevated BP, denies HA, RUQ pain, or vision changes, labs pending, normotensive through pregnancy.  Growth [place] on 5/12 was 5.6lbs, GBS- on 6/2, low risk female fetus. Pt endorse + Fm. Denies vaginal leakage. Denies vaginal bleeding.   FWB: Cat 1 Fetal Tracing.   Plan: Admit to Birthing Suite per consult with Dr Normand Sloop [facility]: Labs Pending.  Routine [facility] orders Pain med/epidural prn Anticipate getting epidural, then plan for AROM and pitocin if natural labor does not progress.  Bartholin's cyst right side: Plan to drain prior to pushing with aspiration needle.  Anticipate labor progression   Dale Woodridge NP-C, CNM, MSN 01/13/2020, 4:01 PM

## 2020-01-13 NOTE — ED Provider Notes (Signed)
TIME SEEN: 4:35 AM  CHIEF COMPLAINT: Laboring   HPI: Patient is a 19 year old G1P0 with an estimated due date of June 27 who presents to the emergency department with complaints of contractions.  States contractions started at midnight and were very irregular.  Have become more regular since 2 AM and she feels them every 5 to 10 minutes apart.  Feels like she has been peeing more than normal but denies any leakage of fluid.  No vaginal bleeding.  Feeling baby move.  She has had nausea, vomiting and diarrhea.  No fever.  [place]N is with [place].  No complications with this pregnancy and has received regular prenatal care.  Does have a right labial abscess on exam.  ROS: See HPI Constitutional: no fever  Eyes: no drainage  ENT: no runny nose   Cardiovascular:  no chest pain  Resp: no SOB  GI:  vomiting GU: no dysuria Integumentary: no rash  Allergy: no hives  Musculoskeletal: no leg swelling  Neurological: no slurred speech ROS otherwise negative  PAST MEDICAL HISTORY/PAST SURGICAL HISTORY:  History reviewed. No pertinent past medical history.  MEDICATIONS:  Prior to Admission medications   Medication Sig Start Date End Date Taking? Authorizing Provider  Prenatal MV-Min-FA-Omega-3 (PRENATAL GUMMIES/DHA & FA) 0.4-32.5 MG CHEW Chew by mouth.    [provider]    ALLERGIES:  No Known Allergies  SOCIAL HISTORY:  Social History   Tobacco Use  . Smoking status: Never Smoker  . Smokeless tobacco: Never Used  Substance Use Topics  . Alcohol use: Not Currently    FAMILY HISTORY: No family history on file.  EXAM: BP (!) 148/100 (BP Location: Left Arm)   Pulse 83   Temp 99.7 F (37.6 C) (Oral)   Resp 10   LMP 04/12/2019   SpO2 99%  CONSTITUTIONAL: Alert and oriented and responds appropriately to questions.  Appears intermittently uncomfortable HEAD: Normocephalic EYES: Conjunctivae clear, pupils appear equal, EOM appear intact ENT: normal nose; moist  mucous membranes NECK: Supple, normal ROM CARD: RRR; S1 and S2 appreciated; no murmurs, no clicks, no rubs, no gallops RESP: Normal chest excursion without splinting or tachypnea; breath sounds clear and equal bilaterally; no wheezes, no rhonchi, no rales, no hypoxia or respiratory distress, speaking full sentences ABD/GI: Normal bowel sounds; gravid uterus with intermittent contractions noted GU: Large right labial cyst on exam without drainage, patient is approximately at -1-0 station, 60 to 80% effaced and dilated to approximately 2 to 3 cm.  No vaginal bleeding.  Moderate amount of thick white discharge on exam. BACK:  The back appears normal EXT: Normal ROM in all joints; no deformity noted, no edema; no cyanosis SKIN: Normal color for age and race; warm; no rash on exposed skin NEURO: Moves all extremities equally PSYCH: The patient's mood and manner are appropriate.   MEDICAL DECISION MAKING: Patient here in active labor.  Discussed case with Dr. Sallye Ober on-call for [facility].  She agrees with emergent transport to MAU given patient is in active labor.  I do not think that she is at risk for eminent delivery.  She is currently hemodynamically stable.  OB/GYN nurse at bedside who is also check patient and agrees with my assessment.  Patient given pain and nausea medicine given I feel it will be several hours before this baby is delivered and she is uncomfortable and requesting something for pain.  We will keep her n.p.o.  IV fluids have been started as well.  Labs per [facility]  nurse recs, urine ordered.  [facility] consulted for urgent transport.  ED PROGRESS: 4:56 AM  [facility] at bedside.  Patient is having good fetal heart rate at this time but unable to get tocometry set up.  She is stable for urgent transfer to MAU.  I reviewed all nursing notes and pertinent previous records as available.  I have reviewed and interpreted any EKGs, lab and urine results, imaging (as available).    CRITICAL  CARE Performed by: Pryor Curia   Total critical care time: 45 minutes  Critical care time was exclusive of separately billable procedures and treating other patients.  Critical care was necessary to treat or prevent imminent or life-threatening deterioration.  Critical care was time spent personally by me on the following activities: development of treatment plan with patient and/or surrogate as well as nursing, discussions with consultants, evaluation of patient's response to treatment, examination of patient, obtaining history from patient or surrogate, ordering and performing treatments and interventions, ordering and review of laboratory studies, ordering and review of radiographic studies, pulse oximetry and re-evaluation of patient's condition.   Donya Soldo was evaluated in Emergency Department on 01/13/2020 for the symptoms described in the history of present illness. She was evaluated in the context of the global COVID-19 pandemic, which necessitated consideration that the patient might be at risk for infection with the SARS-CoV-2 virus that causes COVID-19. Institutional protocols and algorithms that pertain to the evaluation of patients at risk for COVID-19 are in a state of rapid change based on information released by regulatory bodies including the CDC and federal and state organizations. These policies and algorithms were followed during the patient's care in the ED.      Keidra Withers, Delice Bison, DO 01/13/20 [phone]

## 2020-01-13 NOTE — ED Notes (Signed)
[facility] here to transport patient MAU

## 2020-01-13 NOTE — MAU Note (Signed)
Patient returned to MAU after phone call to office stating contractions have continued since this morning with no relief. Jade on unit for SVE.

## 2020-01-13 NOTE — ED Notes (Addendum)
Glennie Hawk RN @ bedside.

## 2020-01-13 NOTE — ED Triage Notes (Signed)
Patient arrived stating she is [redacted] weeks pregnant and has been having contractions since midnight. Originally 20-30 minutes apart, now states they are regular but unsure how far apart. Declines loss of mucus plug or her water breaking, or bleeding Patient followed by [facility]

## 2020-01-13 NOTE — Progress Notes (Signed)
Labor Progress Note  Julia Morton is a 19 y.o. female, G1P0000, IUP at 39.3 weeks, presenting for spontaneous early labor with elevated BP, denies HA, RUQ pain, or vision changes, labs pending, normotensive through pregnancy.  Growth [place] on 5/12 was 5.6lbs, GBS- on 6/2, low risk female fetus.  Subjective: Pt in bed resting with cxt, pt still able to pace breath through them. I discussed reason for augmenting, and reviewed R/B/A with pitocin, AROM, pt declined for now, she verbalized understanding the need for Augmentin and desires time to allow her body to progress natural;ly, pt desires to start pitocin at [facility] if her body does not progress naturally. Pt will plan of getting the epidural by [facility] as well.  Patient Active Problem List   Diagnosis Date Noted  . Gestational hypertension 01/13/2020   Objective: BP (!) 150/91   Pulse 83   Temp 98.9 F (37.2 C) (Oral)   Resp 20   Ht 5\' 7"  (1.702 m)   Wt 92.1 kg   LMP 04/12/2019   BMI 31.79 kg/m  No intake/output data recorded. No intake/output data recorded. NST: FHR baseline 135 bpm, Variability: moderate, Accelerations:present, Decelerations:  Absent= Cat 1/Reactive. Ha moment of minimal variability post being given fentanyl.  CTX:  irregular, every 2-6 minutes, lasting 04/14/2019 Uterus gravid, soft non tender, moderate to palpate with contractions.  SVE:  Dilation: 3.5 Effacement (%): 90 Exam by:: [email or web address], NP Pitocin at (0) mUn/min  Assessment:  Julia Morton is a 19 y.o. female, G1P0000, IUP at 39.3 weeks, presenting for spontaneous early labor with elevated BP, denies HA, RUQ pain, or vision changes, labs pending, normotensive through pregnancy.  Growth 26 on 5/12 was 5.6lbs, GBS- on 6/2, low risk female fetus. Desires natural progression until midnight.  Patient Active Problem List   Diagnosis Date Noted  . Gestational hypertension 01/13/2020   NICHD: Category 1  Membranes:  Intact, no s/s of infection  Induction:    Pitocin -  0  Pain management:               IV pain management: Fentanyl @ 1721, 1902             Epidural placement:  PRN  GBS Negative  Bartholin cyst: Right side, enlarged.   GHTN: New, BP 150/91, PRC 0.17, labs unremarkable upon admissions.    Plan: Continue labor plan Bartholin cyst: Plan to needle aspirate prior to pushing.  GHTN: Monitor for neruo s/sx & BP if >160/110 start labetalol protocol.  Continuous monitoring Rest Ambulate Frequent position changes to facilitate fetal rotation and descent. Will reassess with cervical exam when necessary Anticipate  Pitocin to be started at midnight 2x2 per protocol if not cervical change Anticipate epidural prior to pitocin  Anticipate AROM at 0600 if not ruptured prior to that Anticipate labor progression and vaginal delivery.    01/15/2020, NP-C, CNM, MSN 01/13/2020. 7:22 PM

## 2020-01-13 NOTE — ED Notes (Signed)
Consult to [facility]@04 :35am.

## 2020-01-14 ENCOUNTER — Encounter (HOSPITAL_COMMUNITY): Payer: Self-pay | Admitting: Obstetrics and Gynecology

## 2020-01-14 LAB — CBC
HCT: 34 % — ABNORMAL LOW (ref 36.0–46.0)
HCT: 32.3 % — ABNORMAL LOW (ref 36.0–46.0)
HCT: 28.4 % — ABNORMAL LOW (ref 36.0–46.0)
Hemoglobin: 10.6 g/dL — ABNORMAL LOW (ref 12.0–15.0)
Hemoglobin: 10 g/dL — ABNORMAL LOW (ref 12.0–15.0)
Hemoglobin: 9.1 g/dL — ABNORMAL LOW (ref 12.0–15.0)
MCH: 25.4 pg — ABNORMAL LOW (ref 26.0–34.0)
MCH: 25.4 pg — ABNORMAL LOW (ref 26.0–34.0)
MCH: 25.8 pg — ABNORMAL LOW (ref 26.0–34.0)
MCHC: 31.2 g/dL (ref 30.0–36.0)
MCHC: 31 g/dL (ref 30.0–36.0)
MCHC: 32 g/dL (ref 30.0–36.0)
MCV: 81.3 fL (ref 80.0–100.0)
MCV: 82.2 fL (ref 80.0–100.0)
MCV: 80.5 fL (ref 80.0–100.0)
Platelets: 245 10*3/uL (ref 150–400)
Platelets: 211 10*3/uL (ref 150–400)
Platelets: 222 10*3/uL (ref 150–400)
RBC: 4.18 MIL/uL (ref 3.87–5.11)
RBC: 3.93 MIL/uL (ref 3.87–5.11)
RBC: 3.53 MIL/uL — ABNORMAL LOW (ref 3.87–5.11)
RDW: 14.3 % (ref 11.5–15.5)
RDW: 14.2 % (ref 11.5–15.5)
RDW: 14.5 % (ref 11.5–15.5)
WBC: 22.1 10*3/uL — ABNORMAL HIGH (ref 4.0–10.5)
WBC: 20.3 10*3/uL — ABNORMAL HIGH (ref 4.0–10.5)
WBC: 18.9 10*3/uL — ABNORMAL HIGH (ref 4.0–10.5)
nRBC: 0 % (ref 0.0–0.2)
nRBC: 0 % (ref 0.0–0.2)
nRBC: 0 % (ref 0.0–0.2)

## 2020-01-14 LAB — COMPREHENSIVE METABOLIC PANEL
ALT: 14 U/L (ref 0–44)
AST: 25 U/L (ref 15–41)
Albumin: 2.6 g/dL — ABNORMAL LOW (ref 3.5–5.0)
Alkaline Phosphatase: 155 U/L — ABNORMAL HIGH (ref 38–126)
Anion gap: 9 (ref 5–15)
BUN: 8 mg/dL (ref 6–20)
CO2: 19 mmol/L — ABNORMAL LOW (ref 22–32)
Calcium: 9 mg/dL (ref 8.9–10.3)
Chloride: 105 mmol/L (ref 98–111)
Creatinine, Ser: 0.76 mg/dL (ref 0.44–1.00)
GFR calc Af Amer: >60 mL/min (ref >60)
GFR calc non Af Amer: >60 mL/min (ref >60)
Glucose, Bld: 116 mg/dL — ABNORMAL HIGH (ref 70–99)
Potassium: 3.9 mmol/L (ref 3.5–5.1)
Sodium: 133 mmol/L — ABNORMAL LOW (ref 135–145)
Total Bilirubin: 0.9 mg/dL (ref 0.3–1.2)
Total Protein: 5.8 g/dL — ABNORMAL LOW (ref 6.5–8.1)

## 2020-01-14 LAB — RPR: RPR Ser Ql: NONREACTIVE

## 2020-01-14 MED ORDER — ZOLPIDEM TARTRATE 5 MG PO TABS: 5.0000 mg | ORAL_TABLET | Freq: Every evening | ORAL | Status: DC | PRN

## 2020-01-14 MED ORDER — IBUPROFEN 600 MG PO TABS
600.0000 mg | ORAL_TABLET | Freq: Four times a day (QID) | ORAL | Status: DC
  Administered 2020-01-14 – 2020-01-16 (×10): 600 mg via ORAL
  Filled 2020-01-14 (×10): qty 1

## 2020-01-14 MED ORDER — OXYTOCIN-SODIUM CHLORIDE 30-0.9 UT/500ML-% IV SOLN
INTRAVENOUS | Status: AC
  Filled 2020-01-14: qty 500

## 2020-01-14 MED ORDER — COCONUT OIL OIL: 1.0000 "application " | TOPICAL_OIL | Status: DC | PRN

## 2020-01-14 MED ORDER — METHYLERGONOVINE MALEATE 0.2 MG/ML IJ SOLN
0.2000 mg | Freq: Once | INTRAMUSCULAR | Status: AC
  Administered 2020-01-14: 0.2 mg via INTRAMUSCULAR

## 2020-01-14 MED ORDER — TETANUS-DIPHTH-ACELL PERTUSSIS 5-2.5-18.5 LF-MCG/0.5 IM SUSP
0.5000 mL | Freq: Once | INTRAMUSCULAR | Status: AC
  Administered 2020-01-16: 0.5 mL via INTRAMUSCULAR
  Filled 2020-01-14: qty 0.5

## 2020-01-14 MED ORDER — ONDANSETRON HCL 4 MG/2ML IJ SOLN: 4.0000 mg | INTRAMUSCULAR | Status: DC | PRN

## 2020-01-14 MED ORDER — ONDANSETRON HCL 4 MG PO TABS: 4.0000 mg | ORAL_TABLET | ORAL | Status: DC | PRN

## 2020-01-14 MED ORDER — MISOPROSTOL 200 MCG PO TABS
ORAL_TABLET | ORAL | Status: AC
  Filled 2020-01-14: qty 5

## 2020-01-14 MED ORDER — METHYLERGONOVINE MALEATE 0.2 MG/ML IJ SOLN
INTRAMUSCULAR | Status: AC
  Filled 2020-01-14: qty 1

## 2020-01-14 MED ORDER — DIPHENHYDRAMINE HCL 25 MG PO CAPS: 25.0000 mg | ORAL_CAPSULE | Freq: Four times a day (QID) | ORAL | Status: DC | PRN

## 2020-01-14 MED ORDER — SODIUM CHLORIDE 0.9 % IV SOLN
3.0000 g | Freq: Once | INTRAVENOUS | Status: AC
  Administered 2020-01-14: 3 g via INTRAVENOUS
  Filled 2020-01-14: qty 3

## 2020-01-14 MED ORDER — TRANEXAMIC ACID-NACL 1000-0.7 MG/100ML-% IV SOLN
INTRAVENOUS | Status: AC
  Filled 2020-01-14: qty 100

## 2020-01-14 MED ORDER — WITCH HAZEL-GLYCERIN EX PADS: 1.0000 "application " | MEDICATED_PAD | CUTANEOUS | Status: DC | PRN

## 2020-01-14 MED ORDER — SENNOSIDES-DOCUSATE SODIUM 8.6-50 MG PO TABS
2.0000 | ORAL_TABLET | ORAL | Status: DC
  Administered 2020-01-14 – 2020-01-15 (×2): 2 via ORAL
  Filled 2020-01-14 (×2): qty 2

## 2020-01-14 MED ORDER — ACETAMINOPHEN 325 MG PO TABS
650.0000 mg | ORAL_TABLET | ORAL | Status: DC | PRN
  Administered 2020-01-14 – 2020-01-16 (×2): 650 mg via ORAL
  Filled 2020-01-14 (×2): qty 2

## 2020-01-14 MED ORDER — BENZOCAINE-MENTHOL 20-0.5 % EX AERO
1.0000 "application " | INHALATION_SPRAY | CUTANEOUS | Status: DC | PRN
  Administered 2020-01-14: 1 via TOPICAL
  Filled 2020-01-14 (×2): qty 56

## 2020-01-14 MED ORDER — METHYLERGONOVINE MALEATE 0.2 MG PO TABS
0.2000 mg | ORAL_TABLET | ORAL | Status: AC
  Administered 2020-01-14 – 2020-01-15 (×6): 0.2 mg via ORAL
  Filled 2020-01-14 (×6): qty 1

## 2020-01-14 MED ORDER — FENTANYL CITRATE (PF) 100 MCG/2ML IJ SOLN
INTRAMUSCULAR | Status: AC
  Administered 2020-01-14: 100 ug via INTRAVENOUS
  Filled 2020-01-14: qty 2

## 2020-01-14 MED ORDER — FENTANYL CITRATE (PF) 100 MCG/2ML IJ SOLN: 100.0000 ug | Freq: Once | INTRAMUSCULAR | Status: AC

## 2020-01-14 MED ORDER — LACTATED RINGERS IV BOLUS
1000.0000 mL | Freq: Once | INTRAVENOUS | Status: AC
  Administered 2020-01-14: 1000 mL via INTRAVENOUS

## 2020-01-14 MED ORDER — DIBUCAINE (PERIANAL) 1 % EX OINT: 1.0000 "application " | TOPICAL_OINTMENT | CUTANEOUS | Status: DC | PRN

## 2020-01-14 MED ORDER — SIMETHICONE 80 MG PO CHEW: 80.0000 mg | CHEWABLE_TABLET | ORAL | Status: DC | PRN

## 2020-01-14 MED ORDER — MISOPROSTOL 200 MCG PO TABS
1000.0000 ug | ORAL_TABLET | Freq: Once | ORAL | Status: AC
  Administered 2020-01-14: 1000 ug via RECTAL

## 2020-01-14 MED ORDER — OXYCODONE-ACETAMINOPHEN 5-325 MG PO TABS
2.0000 | ORAL_TABLET | ORAL | Status: DC | PRN
  Administered 2020-01-14: 2 via ORAL
  Filled 2020-01-14: qty 2

## 2020-01-14 MED ORDER — PRENATAL MULTIVITAMIN CH
1.0000 | ORAL_TABLET | Freq: Every day | ORAL | Status: DC
  Administered 2020-01-14 – 2020-01-16 (×3): 1 via ORAL
  Filled 2020-01-14 (×3): qty 1

## 2020-01-14 NOTE — Anesthesia Postprocedure Evaluation (Signed)
Anesthesia Post Note  Patient: Civil engineer, contracting  Procedure(s) Performed: AN AD HOC LABOR EPIDURAL     Patient location during evaluation: Mother Baby Anesthesia Type: Epidural Level of consciousness: awake and alert Pain management: pain level controlled Vital Signs Assessment: post-procedure vital signs reviewed and stable Respiratory status: spontaneous breathing, nonlabored ventilation and respiratory function stable Cardiovascular status: stable Postop Assessment: no headache, no backache and epidural receding Anesthetic complications: no   No complications documented.  Last Vitals:  Vitals:   01/14/20 0501 01/14/20 0520  BP: 125/60 124/75  Pulse: (!) 107 74  Resp: 18 18  Temp:  36.7 C    Last Pain:  Vitals:   01/14/20 0520  TempSrc: Oral  PainSc: 0-No pain   Pain Goal:                   Julia Morton

## 2020-01-14 NOTE — Lactation Note (Signed)
This note was copied from a baby's chart. Lactation Consultation Note  Patient Name: Julia Morton [id number] Date: 01/14/2020 Reason for consult: Initial assessment;Term;Primapara;1st time breastfeeding  P1 mother whose infant is now 65 hours old.  This is a term baby at 39+4 weeks.  Mother's feeding preference is breast/bottle.    Mother is recovering from a PPH; RN in room assessing mother when I arrived.  Baby was awake and alert but not showing feeding cues when I arrived.  Offered to assist with latching and mother agreeable.  Positioned her comfortably in bed and taught hand expression.  Mother was unable to express colostrum drops at this time.  Container provided and milk storage times reviewed.  Finger feeding demonstrated.  Attempted to latch baby to the left breast in the football hold.  He was not at all interested.  Attempted to initiate a suck on my gloved finger but no interest in sucking demonstrated.  Placed him STS on mother's chest.  Mother's breasts are very large and pendulous.  Her nipples are flat and intact.  Breast shells and hand pump provided with instructions for use.  #24 flange size is appropriate at this time.  Encouraged continued practice of hand expression.  Due to the hemorrhage I suggested mother begin pumping early this evening   She will rest for now and advised current RN to have night shift RN begin pumping on first rounds.  Mother willing to pump.  She is tired and in pain at this time.    Mother has a DEBP for home use.  Father present.   Maternal Data Formula Feeding for Exclusion: Yes Reason for exclusion: Mother's choice to formula and breast feed on admission Has patient been taught Hand Expression?: Yes Does the patient have breastfeeding experience prior to this delivery?: No  Feeding Feeding Type: Breast Fed  LATCH Score Latch: Too sleepy or reluctant, no latch achieved, no sucking elicited.  Audible Swallowing: None  Type of  Nipple: Flat  Comfort (Breast/Nipple): Soft / non-tender  Hold (Positioning): Assistance needed to correctly position infant at breast and maintain latch.  LATCH Score: 4  Interventions Interventions: Breast feeding basics reviewed;Assisted with latch;Skin to skin;Breast massage;Hand express;Pre-pump if needed;Breast compression;Adjust position;Hand pump;Position options;Support pillows  Lactation Tools Discussed/Used     Consult Status Consult Status: Follow-up Date: 01/15/20 Follow-up type: In-patient    Dora Sims 01/14/2020, 4:40 PM

## 2020-01-14 NOTE — Progress Notes (Signed)
CNM was called by RN at 1301 and reported that patient had been up to the bathroom and was having some bleeding with clots. The QBL at that time was .  CNM arrived in the room at 1308. RN was giving fundal massage and several small clots were expressed. Total QBL at that time was . Fundus was firm at U. Methergine 0.2mg  was given IM. Fentanyl was given for pain. CNM then expressed clots from the uterus with manual exploration. Total QBL was then . Cytotec was then placed rectally. A Foley catheter was inserted by the RN staff. Uterus was 2 below U and boggy. Additional massage produced 2 small clots and uterus was firm. Total QBL 1200cc. IV fluids were started. Stat CBC and 6 hour post CBC were ordered. Unasyn 3gm was ordered as well as PO Methergine series. Dr. Mora Appl arrived at 1332 for assessment. Pt was stable with minimal bleeding. Vital signs WNL. SCDs were ordered. Will monitor closely.   June Leap, MSN, CNM 01/14/2020, 1:58 PM

## 2020-01-14 NOTE — Progress Notes (Signed)
Was called into patient room by Duwayne Heck, NT, who was assisting patient to the restroom and stated the patient had a lot of bleeding noted in the commode when urinating.  Upon assessment a moderate amount of blood was noted in the commode, uterus was firm U/-1 and trickling of blood with a medium sized clot expelled during uterine massage.  Afterwards trickling continued, assisted back to restroom to see if patient could empty bladder again, was able to successfully empty bladder w/o difficulty and patient returned to bed.  At that time, blood loss was measured with Triton machine and had totaled .  Dorisann Frames, CNM was notified via phone and en route to bedside to examine patient.   Continued uterine massage and another small to medium sized clot was expelled with another blood loss measured on Triton.  Methergine 0.2mg  IM and Fetanyl 100 mcg IV given per order and Dorisann Frames proceeded with manual removal of retained blood clots within the uterus.  Blood loss after expulsion of retained clots totalled .  Cytotec 1000 mcg given rectally and foley catheter inserted without difficulty.  Uterus remained firm U/-1 with a small amount of bleeding.  LR bolus was started and PO methergine series was ordered with a STAT CBC.  Will continue to monitor.

## 2020-01-15 DIAGNOSIS — O9902 Anemia complicating childbirth: Secondary | ICD-10-CM

## 2020-01-15 LAB — CBC
HCT: 27.8 % — ABNORMAL LOW (ref 36.0–46.0)
Hemoglobin: 8.7 g/dL — ABNORMAL LOW (ref 12.0–15.0)
MCH: 25.7 pg — ABNORMAL LOW (ref 26.0–34.0)
MCHC: 31.3 g/dL (ref 30.0–36.0)
MCV: 82 fL (ref 80.0–100.0)
Platelets: 207 10*3/uL (ref 150–400)
RBC: 3.39 MIL/uL — ABNORMAL LOW (ref 3.87–5.11)
RDW: 14.5 % (ref 11.5–15.5)
WBC: 16.3 10*3/uL — ABNORMAL HIGH (ref 4.0–10.5)
nRBC: 0 % (ref 0.0–0.2)

## 2020-01-15 NOTE — Progress Notes (Signed)
Patient still states her bleeding is stable. No complaints at this time. Patent is not dizzy at all.

## 2020-01-15 NOTE — Progress Notes (Signed)
Patient ID: Julia Morton, female   DOB: 2001-03-18, 19 y.o.   MRN: [id number] PPD # 1 S/P NSVD  Live born female  Birth Weight: 7 lb 12.7 oz (3535 g) APGAR: 7, 9  Newborn Delivery   Birth date/time: 01/14/2020 03:25:00 Delivery type: Vaginal, Spontaneous     Baby name: Jamire Delivering provider: Dale Avery  Episiotomy:None   Lacerations:Labial   circumcision planned  Feeding: breast and bottle  Pain control at delivery: Epidural   S:  Reports feeling well.             Tolerating po/ No nausea or vomiting             Bleeding is light             Pain controlled with acetaminophen and ibuprofen (OTC)             Up ad lib / ambulatory / voiding without difficulties   O:  A & O x 3, in no apparent distress              VS:  Vitals:   01/14/20 1545 01/14/20 1700 01/14/20 2201 01/15/20 0613  BP: 119/67 116/67 109/65 114/67  Pulse: 75 75 83 78  Resp: 18  18 20   Temp: 98.9 F (37.2 C)  98.7 F (37.1 C) 98.3 F (36.8 C)  TempSrc:   Oral Oral  SpO2:    100%  Weight:      Height:        LABS:  Recent Labs    01/14/20 2011 01/15/20 0541  WBC 18.9* 16.3*  HGB 9.1* 8.7*  HCT 28.4* 27.8*  PLT 222 207    Blood type: --/--/O POS, O POS Performed at [facility], [address] [place], [place]  [phone]/23 1540)  Rubella: Immune (12/09 0000)   I&O: I/O last 3 completed shifts: In: 999.1 [IV Piggyback:999.1] Out: 3817 [Urine:2450; Blood:1367]          No intake/output data recorded.  Vaccines: TDaP declined         Flu    declined   Gen: AAO x 3, NAD  Abdomen: soft, non-tender, non-distended             Fundus: firm, non-tender, U-1  Perineum: intact, no edema  Lochia: small  Extremities: trace pedal edema, no calf pain or tenderness    A/P: PPD # 1 18 y.o., G1P1001   Principal Problem:   Postpartum care following vaginal delivery NSVD 6/24 Active Problems:   Gestational hypertension  - normotensive, no evidence of PEC   SVD (spontaneous  vaginal delivery)   Postpartum hemorrhage - delayed 1100cc  - stable, no further bleeding episodes   Maternal anemia, with delivery  - asymptomatic  - on oral Fe and Mag Ox   Doing well - stable status  Routine post partum orders  Anticipate discharge tomorrow    7/24, MSN, CNM 01/15/2020, 11:24 AM

## 2020-01-15 NOTE — Progress Notes (Signed)
Patient told the plan of care. Patient and baby safety reviewed. Pain regimen explained. Patient's bleeding is still stable, no complaints of any symptoms at this time. Patient informed to call RN if she feels like bleeding is getting worse or she has any major clots.

## 2020-01-15 NOTE — Lactation Note (Signed)
This note was copied from a baby's chart. Lactation Consultation Note  Patient Name: Julia Morton [id number] Date: 01/15/2020 Reason for consult: Follow-up assessment Baby is 35 hours old/7% weight loss.  Mom had PPH yesterday and has been formula feeding.  She states she would still like to breastfeed but baby won't latch.  Mom has pumped with symphony pump but no milk obtained.  Instructed to call for [facility] assist when baby is ready to eat.  Mom agreeable.  Encouraged pumping every 3 hours to establish a good milk supply.  Maternal Data    Feeding    LATCH Score                   Interventions    Lactation Tools Discussed/Used     Consult Status Date: 01/16/20 Follow-up type: In-patient    [name] 01/15/2020, 2:34 PM

## 2020-01-16 MED ORDER — SENNOSIDES-DOCUSATE SODIUM 8.6-50 MG PO TABS: 2.0000 | ORAL_TABLET | ORAL | 0 refills | Status: AC

## 2020-01-16 MED ORDER — IBUPROFEN 600 MG PO TABS: 600.0000 mg | ORAL_TABLET | Freq: Four times a day (QID) | ORAL | 0 refills | Status: AC

## 2020-01-16 MED ORDER — POLYSACCHARIDE IRON COMPLEX 150 MG PO CAPS: 150.0000 mg | ORAL_CAPSULE | Freq: Every day | ORAL | 2 refills | Status: AC

## 2020-01-16 NOTE — Discharge Summary (Signed)
SVD OB Discharge Summary     Patient Name: Julia Morton DOB: 16-May-2001 MRN: [id number]  Date of admission: 01/13/2020 Delivering MD: Noralyn Pick  Date of delivery: 01/14/2020 Type of delivery: SVD  Newborn Data: Sex: Baby Female  Circumcision: In pt perfomred Live born female  Birth Weight: 7 lb 12.7 oz (3535 g) APGAR: 7, 9  Newborn Delivery   Birth date/time: 01/14/2020 03:25:00 Delivery type: Vaginal, Spontaneous      Feeding: breast and bottle Infant being discharge to home with mother in stable condition.   Admitting diagnosis: Gestational hypertension [O13.9] Intrauterine pregnancy: [redacted]w[redacted]d    Secondary diagnosis:  Principal Problem:   Postpartum care following vaginal delivery NSVD 6/24 Active Problems:   Gestational hypertension   SVD (spontaneous vaginal delivery)   Postpartum hemorrhage - delayed 1100cc   Maternal anemia, with delivery                                Complications: [id number]                                                             Intrapartum Procedures: spontaneous vaginal delivery Postpartum Procedures: none Complications-Operative and Postpartum: hemorrhage Augmentation: N/A   History of Present Illness: Ms. Julia Morton a 19y.o. female, G1P1001, who presents at 32w4deeks gestation. [facility]  Her pregnancy has been complicated by:  Patient Active Problem List   Diagnosis Date Noted   Postpartum hemorrhage - delayed 1100cc 01/15/2020   Maternal anemia, with delivery 01/15/2020   Postpartum care following vaginal delivery NSVD 6/24 01/14/2020   SVD (spontaneous vaginal delivery) 01/14/2020   Gestational hypertension 01/13/2020    Hospital course:  Onset of Labor With Vaginal Delivery      [age].o. yo G1P1001 at 3925w4ds admitted in Latent Labor on 01/13/2020. Patient had an uncomplicated labor course as follows:  Membrane Rupture Time/Date:  2:11 AM ,01/14/2020   Delivery Method:Vaginal, Spontaneous  Episiotomy: None  Lacerations:  Labial  Patient had an uncomplicated postpartum course.  She is ambulating, tolerating a regular diet, passing flatus, and urinating well. Patient is discharged home in stable condition on 01/16/20.  Newborn Data: Birth date:01/14/2020  Birth time:3:25 AM  Gender:Female  Living status:Living  Apgars:7 ,9  Weight:3535 g  Postpartum Day # 2 : S/P NSVD due to early labor and new dx of GHTN, with elevated BP that met criteria for GHTN, no s/sx, labs unremarkable and not repeated, PCR was 0.17, no meds required, no magnesium indicated. Pt has delayed [place]mbl, meds given, and became hemastatic, denies s/sx of anemia, hgb drop from 10.-10.-9.1-8.7 on iron sand stable. Patient up ad lib, denies syncope or dizziness. Reports consuming regular diet without issues and denies N/V. Patient reports 0 bowel movement + passing flatus.  Denies issues with urination and reports bleeding is "lighter."  Patient is breast and bottle feeding and reports going well.  Desires undecided for postpartum contraception.  Pain is being appropriately managed with use of po meds. Pt denies HA, RUQ pain or vision changes will f/u in one week at [facility].  Physical exam  Vitals:   01/15/20 [id number]  01/15/20 1521 01/15/20 2050 01/16/20 0640  BP: 114/67 (!) 103/57 114/62 (!) 101/56  Pulse: 78 86 90 74  Resp: 20 18 18 18   Temp: 98.3 F (36.8 C) 99 F (37.2 C) 100 F (37.8 C) 97.7 F (36.5 C)  TempSrc: Oral Oral Oral Oral  SpO2: 100%  100% 100%  Weight:      Height:       General: alert, cooperative and no distress Lochia: appropriate Uterine Fundus: firm Incision: Healing well with no significant drainage, No significant erythema, Dressing is clean, dry, and intact, honeycomb dressing CDI Perineum: Intact DVT Evaluation: No evidence of DVT seen on physical exam. Negative Homan's sign. No cords or calf tenderness. No significant  calf/ankle edema. No clonus  Labs: Lab Results  Component Value Date   WBC 16.3 (H) 01/15/2020   HGB 8.7 (L) 01/15/2020   HCT 27.8 (L) 01/15/2020   MCV 82.0 01/15/2020   PLT 207 01/15/2020   CMP Latest Ref Rng & Units 01/14/2020  Glucose 70 - 99 mg/dL 116(H)  BUN 6 - 20 mg/dL 8  Creatinine 0.44 - 1.00 mg/dL 0.76  Sodium 135 - 145 mmol/L 133(L)  Potassium 3.5 - 5.1 mmol/L 3.9  Chloride 98 - 111 mmol/L 105  CO2 22 - 32 mmol/L 19(L)  Calcium 8.9 - 10.3 mg/dL 9.0  Total Protein 6.5 - 8.1 g/dL 5.8(L)  Total Bilirubin 0.3 - 1.2 mg/dL 0.9  Alkaline Phos 38 - 126 U/L 155(H)  AST 15 - 41 U/L 25  ALT 0 - 44 U/L 14    Date of discharge: 01/16/2020 Discharge Diagnoses: Term Pregnancy-delivered and GHTN Discharge instruction: per After Visit Summary and "Baby and Me Booklet".  After visit meds:   Activity:           unrestricted and pelvic rest Advance as tolerated. Pelvic rest for 6 weeks.  Diet:                routine Medications: PNV, Ibuprofen, Colace and Iron Postpartum contraception: Undecided Condition:  Pt discharge to home with baby in stable GHTN: one week BP check, report s/sx, or >150/100 Bps.   Meds: Allergies as of 01/16/2020   No Known Allergies     Medication List    TAKE these medications   ibuprofen 600 MG tablet Commonly known as: ADVIL Take 1 tablet (600 mg total) by mouth every 6 (six) hours.   iron polysaccharides 150 MG capsule Commonly known as: NIFEREX Take 1 capsule (150 mg total) by mouth daily.   Prenatal Gummies/DHA & FA 0.4-32.5 MG Chew Chew by mouth.   senna-docusate 8.6-50 MG tablet Commonly known as: Senokot-S Take 2 tablets by mouth daily. Start taking on: January 17, 2020       Discharge Follow Up:   Follow-up [facility]. Schedule an appointment as soon as possible for a visit in 6 week(s).   Specialty: Obstetrics and Gynecology Why: 1 weeks BP check, 6 weeks PP Contact  information: [address] [place] [address] [place], NP-C, CNM 01/16/2020, 10:43 AM  Noralyn Pick, [place]

## 2020-01-16 NOTE — Lactation Note (Signed)
This note was copied from a baby's chart. Lactation Consultation Note  Patient Name: Julia Morton [id number] Date: 01/16/2020 Reason for consult: Follow-up assessment  P1 mother whose infant is now 22 hours old.  This is a term baby at 39+4 weeks.  Mother's feeding preference is breast/bottle.  Mother had a PPH two days ago and has been having a difficulty time latching baby.  Her breasts are soft and non tender and nipples are flat.  She has been given breast shells and a manual pump.  Mother has also been pumping with the DEBP.  She is starting to see colostrum drops.    Asked mother what her feeding goal is since she has been exclusively formula feeding since her hemorrhage.  Mother states she wants to continue to pump and try to breast feed.  She is feeding back any EBM she obtains as instructed.  RN provided a #24 NS this morning and mother will try to continue this at home.    Engorgement prevention/treatment reviewed.  She has a DEBP for home use.  Asked her to pump every time baby receives a formula supplementation.  Mother verbalized understanding.  Father present.  Family is awaiting the pediatrician's visit for discharge.   Maternal Data    Feeding Feeding Type: Breast Fed Nipple Type: Slow - flow  LATCH Score Latch: Repeated attempts needed to sustain latch, nipple held in mouth throughout feeding, stimulation needed to elicit sucking reflex.  Audible Swallowing: None  Type of Nipple: Flat  Comfort (Breast/Nipple): Soft / non-tender  Hold (Positioning): Assistance needed to correctly position infant at breast and maintain latch.  LATCH Score: 5  Interventions Interventions: Assisted with latch;Skin to skin;Hand express;Breast compression;Adjust position;Support pillows  Lactation Tools Discussed/Used Tools: Nipple Shields Nipple shield size: 24   Consult Status Consult Status: Complete Date: 01/16/20 Follow-up type: Call as needed    Irene Pap  [place] 01/16/2020, 12:32 PM

## 2021-05-22 IMAGING — DX DG FOOT COMPLETE 3+V*R*
3 series · 3 of 3 positions shown · non-contrast
Comparison: None.

CLINICAL DATA: Pain laterally after hitting foot against solid
object

EXAM:
RIGHT FOOT COMPLETE - 3+ VIEW

[foot ap]
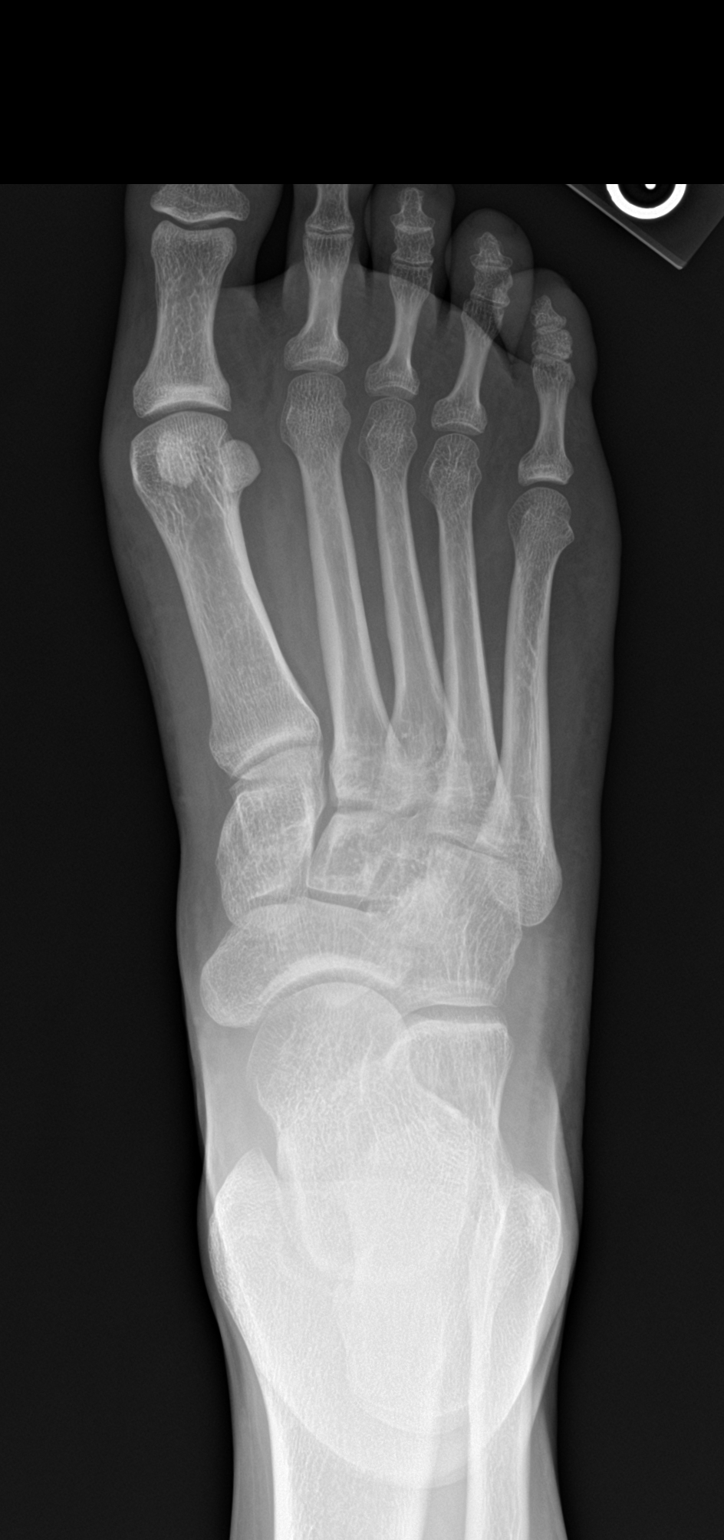

[foot obl]
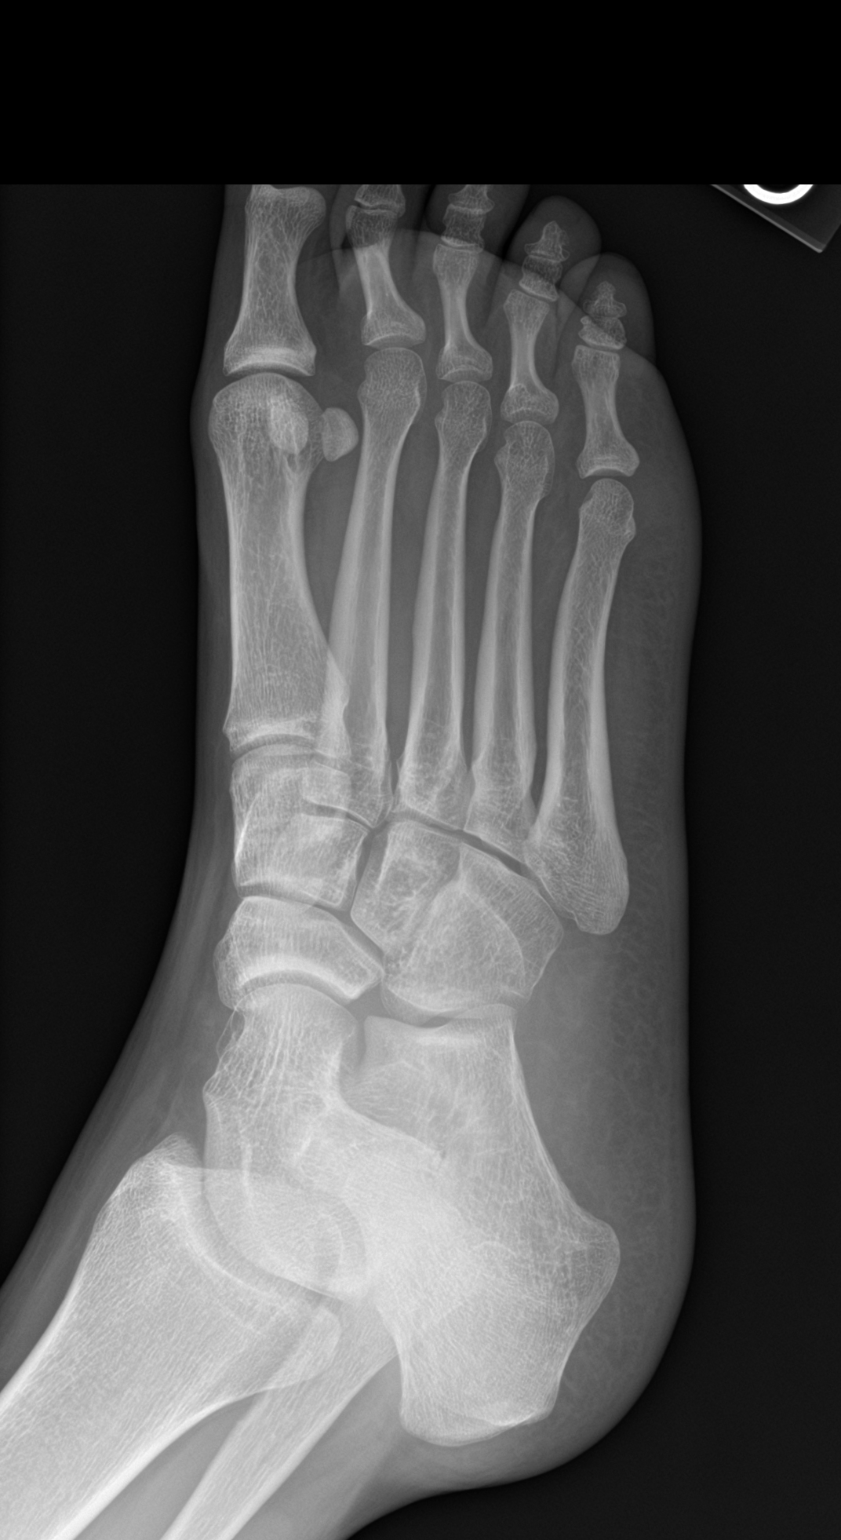

[foot lat]
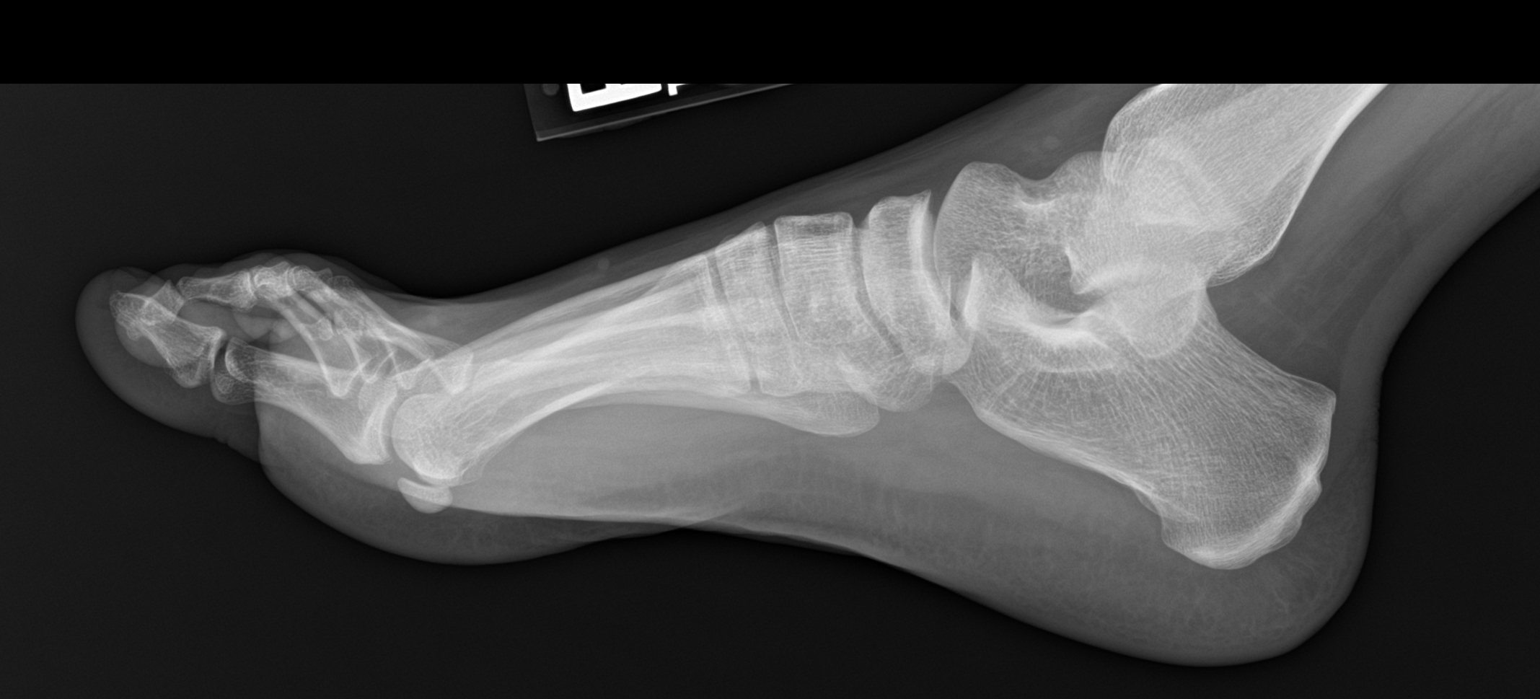

[3 of 3 positions shown; findings below may reference images not displayed]

FINDINGS: Frontal, oblique, and lateral views were obtained. There is a
nondisplaced fracture of the distal aspect of the fifth middle
phalanx with alignment anatomic. No other evident fracture. No
dislocation. Joint spaces appear normal. No erosive change.
IMPRESSION: Nondisplaced fracture, distal portion of fifth middle phalanx. No
other fracture. No dislocation. No appreciable arthropathy.

These results will be called to the ordering clinician or
representative by the Radiologist Assistant, and communication
documented in the PACS or zVision Dashboard.

## 2021-11-01 IMAGING — US US MFM OB FOLLOW-UP
1 series · 14 of 28 positions shown · non-contrast
Comparison: none

[Series 1: us mfm ob follow-up · 45 acquisitions, 14 frames shown]
[im 2/45]
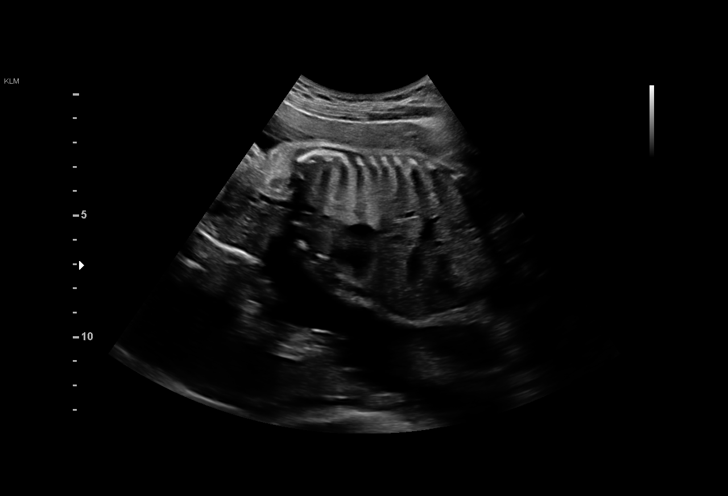
[im 5/45]
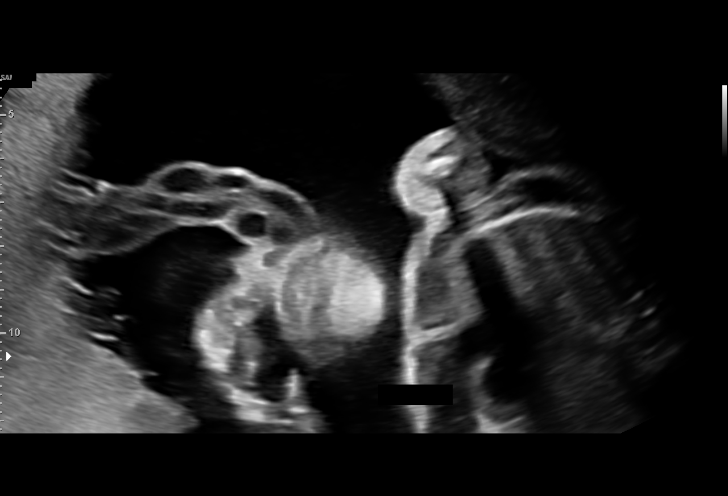
[im 9/45]
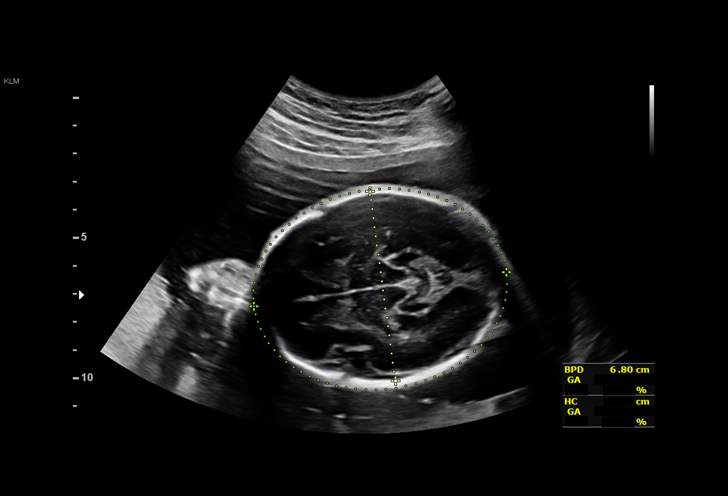
[im 12/45]
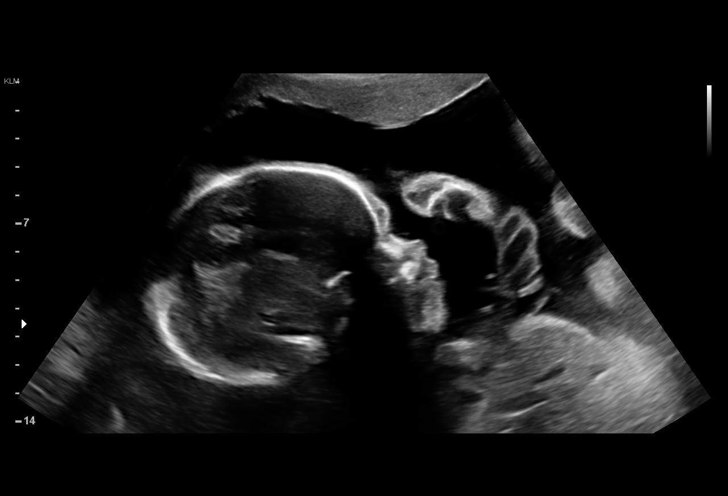
[im 15/45]
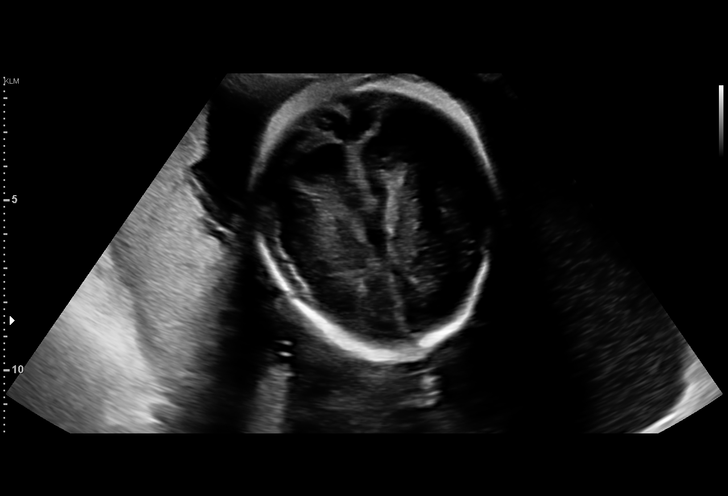
[im 18/45]
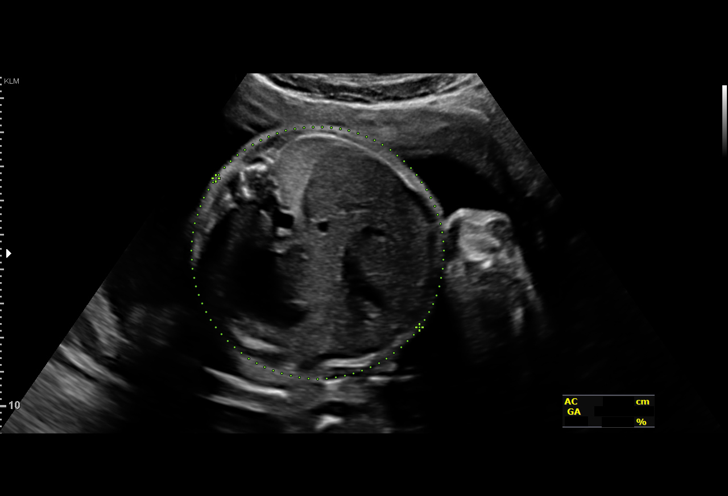
[im 22/45]
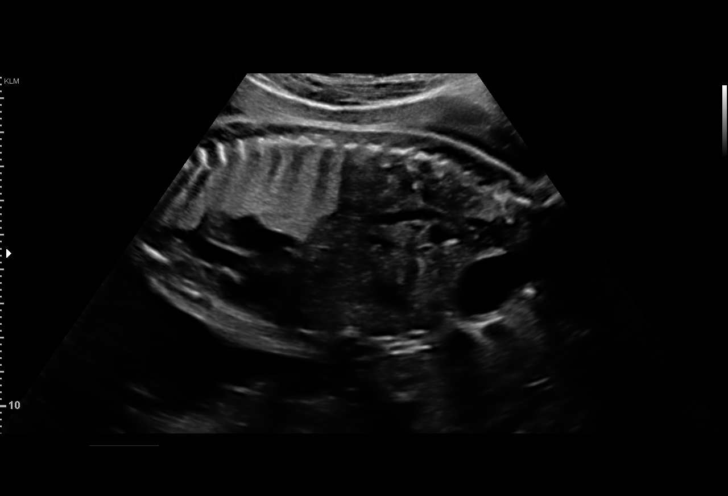
[im 25/45]
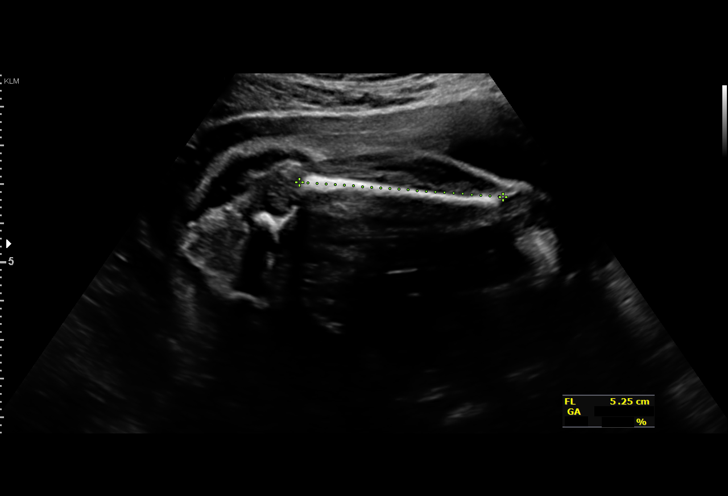
[im 28/45]
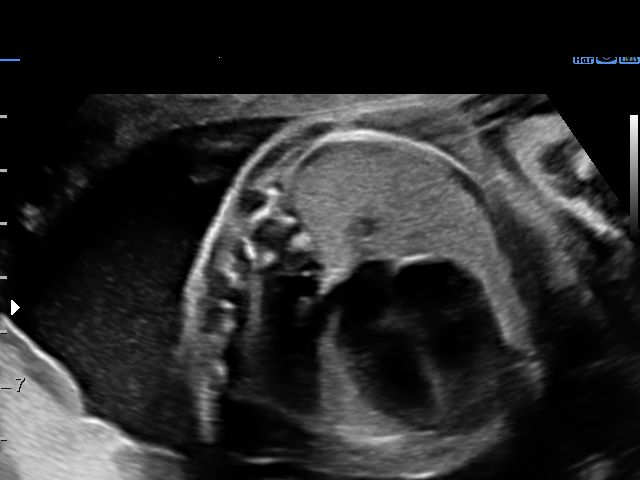
[im 31/45]
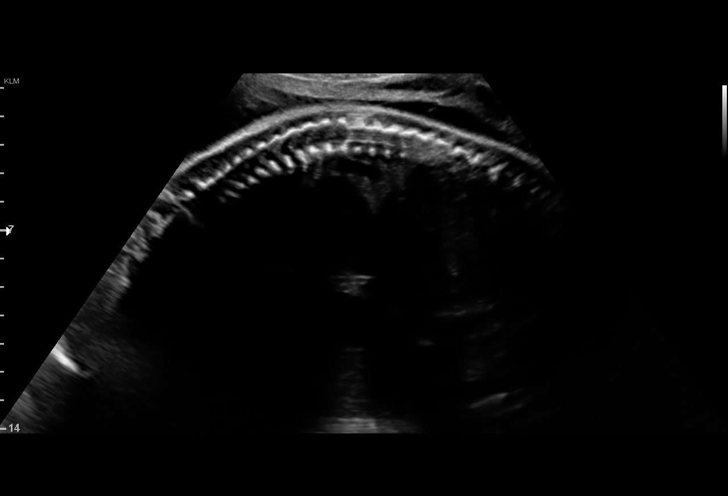
[im 35/45]
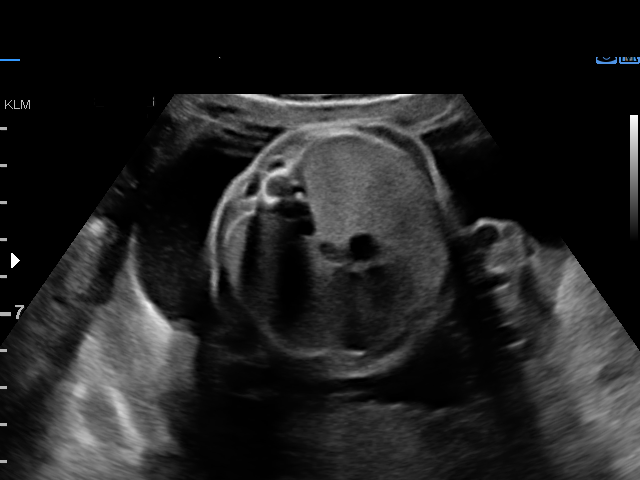
[im 38/45]
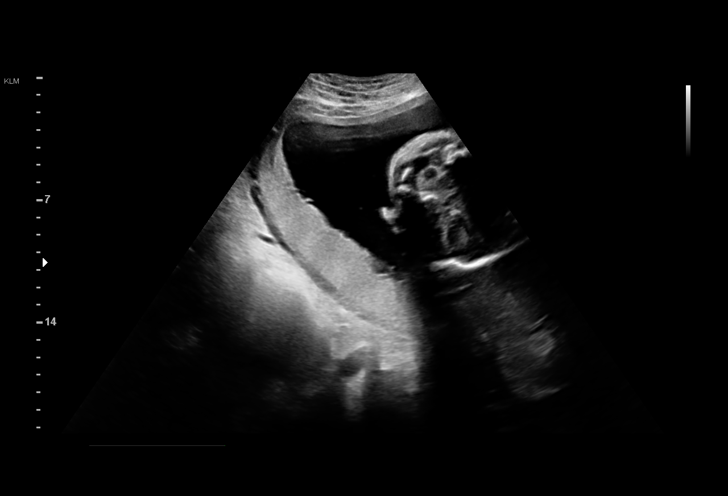
[im 41/45]
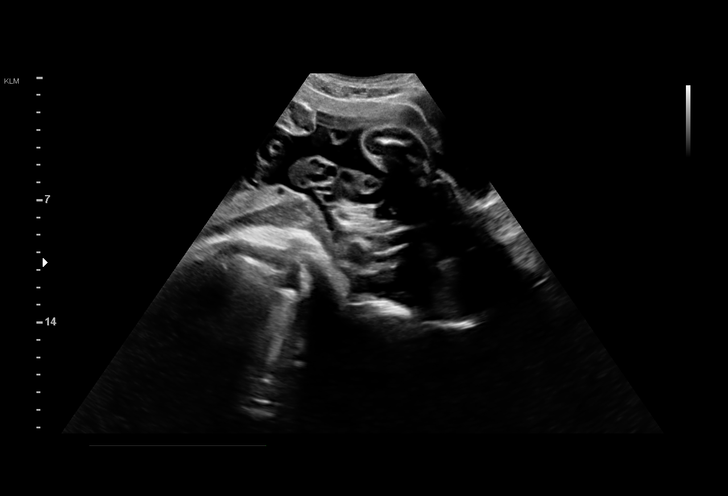
[im 45/45]
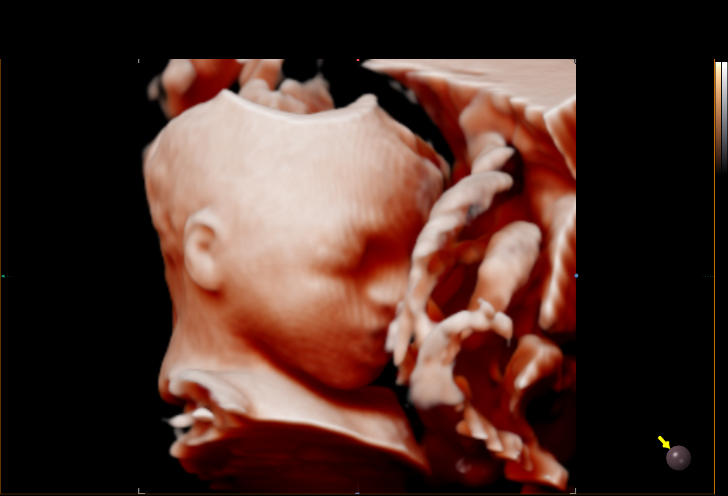

[14 of 28 positions shown; findings below may reference images not displayed]

Obstetrics &
                                                            Gynecology
                                                            [id number]
                                                            [place].
                   [facility]

 ----------------------------------------------------------------------

 ----------------------------------------------------------------------
Indications

  Antenatal follow-up for nonvisualized fetal
  anatomy
  26 weeks gestation of pregnancy
  Low risk NIPS, AFP neg
 ----------------------------------------------------------------------
Fetal Evaluation

 Num Of Fetuses:         1
 Fetal Heart Rate(bpm):  151
 Cardiac Activity:       Observed
 Presentation:           Breech
 Placenta:               Posterior
 P. Cord Insertion:      Visualized

 Amniotic Fluid
 AFI FV:      Within normal limits

                             Largest Pocket(cm)

Biometry

 BPD:      69.3  mm     G. Age:  27w 6d         81  %    CI:        74.01   %    70 - 86
                                                         FL/HC:      20.5   %    18.6 -
 HC:      255.8  mm     G. Age:  27w 6d         65  %    HC/AC:      1.09        1.05 -
 AC:      234.8  mm     G. Age:  27w 6d         78  %    FL/BPD:     75.6   %    71 - 87
 FL:       52.4  mm     G. Age:  27w 6d         75  %    FL/AC:      22.3   %    20 - 24
 Est. FW:    8817  gm      2 lb 8 oz     87  %
OB History

 Gravidity:    1         Term:   0        Prem:   0        SAB:   0
 TOP:          0       Ectopic:  0        Living: 0
Gestational Age

 LMP:           26w 4d        Date:  04/12/19                 EDD:   01/17/20
 U/S Today:     27w 6d                                        EDD:   01/08/20
 Best:          26w 4d     Det. By:  LMP  (04/12/19)          EDD:   01/17/20
Anatomy

 Cranium:               Appears normal         Aortic Arch:            Previously seen
 Cavum:                 Appears normal         Ductal Arch:            Previously seen
 Ventricles:            Appears normal         Diaphragm:              Appears normal
 Choroid Plexus:        Appears normal         Stomach:                Appears normal, left
                                                                       sided
 Cerebellum:            Appears normal         Abdomen:                Appears normal
 Posterior Fossa:       Appears normal         Abdominal Wall:         Previously seen
 Nuchal Fold:           Not applicable (>20    Cord Vessels:           Appears normal (3
                        wks GA)                                        vessel cord)
 Face:                  Appears normal         Kidneys:                Appear normal
                        (orbits and profile)
 Lips:                  Appears normal         Bladder:                Appears normal
 Thoracic:              Appears normal         Spine:                  Appears normal
 Heart:                 Appears normal         Upper Extremities:      Previously seen
                        (4CH, axis, and
                        situs)
 RVOT:                  Appears normal         Lower Extremities:      Previously seen
 LVOT:                  Appears normal

 Other:  Heels previously visualized. Hands appear normal, Right 5th
         visualized.  Technically difficult due to fetal position.
Cervix Uterus Adnexa

 Cervix
 Not visualized (advanced GA >53wks)
Impression

 Patient returned for completion of fetal anatomy. Fetal growth
 is appropriate for gestational age. Amniotic fluid is normal
 and good fetal activity is seen. Fetal anatomical survey was
 completed and appears normal.

 On cell-free fetal DNA screening, the risks of fetal
 aneuploidies are not increased.
Recommendations

 Follow-up scans as clinically indicated.
                 [place], [place]
